# Patient Record
Sex: Female | Born: 1940 | Race: White | Hispanic: No | Marital: Married | Smoking: Never smoker
Health system: Southern US, Community
[De-identification: ages and names within clinical notes are randomized; demographics above are authoritative.]

## PROBLEM LIST (undated history)

## (undated) DIAGNOSIS — E785 Hyperlipidemia, unspecified: Secondary | ICD-10-CM

## (undated) DIAGNOSIS — E669 Obesity, unspecified: Secondary | ICD-10-CM

## (undated) DIAGNOSIS — K219 Gastro-esophageal reflux disease without esophagitis: Secondary | ICD-10-CM

## (undated) DIAGNOSIS — F419 Anxiety disorder, unspecified: Secondary | ICD-10-CM

## (undated) DIAGNOSIS — I10 Essential (primary) hypertension: Secondary | ICD-10-CM

## (undated) DIAGNOSIS — R112 Nausea with vomiting, unspecified: Secondary | ICD-10-CM

## (undated) DIAGNOSIS — T4145XA Adverse effect of unspecified anesthetic, initial encounter: Secondary | ICD-10-CM

## (undated) DIAGNOSIS — H409 Unspecified glaucoma: Secondary | ICD-10-CM

## (undated) DIAGNOSIS — E559 Vitamin D deficiency, unspecified: Secondary | ICD-10-CM

## (undated) DIAGNOSIS — G4700 Insomnia, unspecified: Secondary | ICD-10-CM

## (undated) DIAGNOSIS — F32A Depression, unspecified: Secondary | ICD-10-CM

## (undated) DIAGNOSIS — Z9889 Other specified postprocedural states: Secondary | ICD-10-CM

## (undated) DIAGNOSIS — T8859XA Other complications of anesthesia, initial encounter: Secondary | ICD-10-CM

## (undated) DIAGNOSIS — R739 Hyperglycemia, unspecified: Secondary | ICD-10-CM

## (undated) HISTORY — PX: ANKLE SURGERY: SHX546

## (undated) HISTORY — DX: Insomnia, unspecified: G47.00

## (undated) HISTORY — PX: EYE SURGERY: SHX253

## (undated) HISTORY — DX: Hyperglycemia, unspecified: R73.9

## (undated) HISTORY — PX: CHOLECYSTECTOMY: SHX55

## (undated) HISTORY — DX: Hyperlipidemia, unspecified: E78.5

## (undated) HISTORY — DX: Depression, unspecified: F32.A

## (undated) HISTORY — DX: Vitamin D deficiency, unspecified: E55.9

## (undated) HISTORY — DX: Unspecified glaucoma: H40.9

## (undated) HISTORY — DX: Obesity, unspecified: E66.9

---

## 2003-10-28 ENCOUNTER — Encounter: Admission: RE | Admit: 2003-10-28 | Discharge: 2003-10-28 | Payer: Self-pay | Admitting: Internal Medicine

## 2004-10-29 ENCOUNTER — Encounter: Admission: RE | Admit: 2004-10-29 | Discharge: 2004-10-29 | Payer: Self-pay | Admitting: Internal Medicine

## 2005-09-26 ENCOUNTER — Other Ambulatory Visit: Admission: RE | Admit: 2005-09-26 | Discharge: 2005-09-26 | Payer: Self-pay | Admitting: Internal Medicine

## 2005-10-30 ENCOUNTER — Encounter: Admission: RE | Admit: 2005-10-30 | Discharge: 2005-10-30 | Payer: Self-pay | Admitting: Internal Medicine

## 2006-11-03 ENCOUNTER — Encounter: Admission: RE | Admit: 2006-11-03 | Discharge: 2006-11-03 | Payer: Self-pay | Admitting: Internal Medicine

## 2007-11-13 ENCOUNTER — Encounter: Admission: RE | Admit: 2007-11-13 | Discharge: 2007-11-13 | Payer: Self-pay | Admitting: Internal Medicine

## 2008-11-23 ENCOUNTER — Encounter: Admission: RE | Admit: 2008-11-23 | Discharge: 2008-11-23 | Payer: Self-pay | Admitting: Internal Medicine

## 2009-11-30 ENCOUNTER — Encounter: Admission: RE | Admit: 2009-11-30 | Discharge: 2009-11-30 | Payer: Self-pay | Admitting: Internal Medicine

## 2010-02-03 ENCOUNTER — Encounter: Payer: Self-pay | Admitting: Internal Medicine

## 2010-10-03 ENCOUNTER — Other Ambulatory Visit: Payer: Self-pay | Admitting: Internal Medicine

## 2010-10-03 DIAGNOSIS — Z1231 Encounter for screening mammogram for malignant neoplasm of breast: Secondary | ICD-10-CM

## 2010-12-03 ENCOUNTER — Ambulatory Visit
Admission: RE | Admit: 2010-12-03 | Discharge: 2010-12-03 | Disposition: A | Discharge status: Home / Self Care | Payer: Medicare Other | Source: Ambulatory Visit | Attending: Internal Medicine | Admitting: Internal Medicine

## 2010-12-03 DIAGNOSIS — Z1231 Encounter for screening mammogram for malignant neoplasm of breast: Secondary | ICD-10-CM

## 2011-02-08 DIAGNOSIS — R7301 Impaired fasting glucose: Secondary | ICD-10-CM | POA: Diagnosis not present

## 2011-02-08 DIAGNOSIS — H409 Unspecified glaucoma: Secondary | ICD-10-CM | POA: Diagnosis not present

## 2011-02-08 DIAGNOSIS — Z0000 Encounter for general adult medical examination without abnormal findings: Secondary | ICD-10-CM | POA: Diagnosis not present

## 2011-02-08 DIAGNOSIS — E785 Hyperlipidemia, unspecified: Secondary | ICD-10-CM | POA: Diagnosis not present

## 2011-02-15 DIAGNOSIS — R7301 Impaired fasting glucose: Secondary | ICD-10-CM | POA: Diagnosis not present

## 2011-02-15 DIAGNOSIS — F411 Generalized anxiety disorder: Secondary | ICD-10-CM | POA: Diagnosis not present

## 2011-02-15 DIAGNOSIS — E785 Hyperlipidemia, unspecified: Secondary | ICD-10-CM | POA: Diagnosis not present

## 2011-03-05 DIAGNOSIS — H4011X0 Primary open-angle glaucoma, stage unspecified: Secondary | ICD-10-CM | POA: Diagnosis not present

## 2011-04-23 DIAGNOSIS — M79609 Pain in unspecified limb: Secondary | ICD-10-CM | POA: Diagnosis not present

## 2011-05-01 DIAGNOSIS — M79609 Pain in unspecified limb: Secondary | ICD-10-CM | POA: Diagnosis not present

## 2011-05-01 DIAGNOSIS — I8310 Varicose veins of unspecified lower extremity with inflammation: Secondary | ICD-10-CM | POA: Diagnosis not present

## 2011-05-18 DIAGNOSIS — R030 Elevated blood-pressure reading, without diagnosis of hypertension: Secondary | ICD-10-CM | POA: Diagnosis not present

## 2011-05-18 DIAGNOSIS — J309 Allergic rhinitis, unspecified: Secondary | ICD-10-CM | POA: Diagnosis not present

## 2011-05-18 DIAGNOSIS — J209 Acute bronchitis, unspecified: Secondary | ICD-10-CM | POA: Diagnosis not present

## 2011-05-21 DIAGNOSIS — J209 Acute bronchitis, unspecified: Secondary | ICD-10-CM | POA: Diagnosis not present

## 2011-05-27 DIAGNOSIS — I8310 Varicose veins of unspecified lower extremity with inflammation: Secondary | ICD-10-CM | POA: Diagnosis not present

## 2011-05-27 DIAGNOSIS — M79609 Pain in unspecified limb: Secondary | ICD-10-CM | POA: Diagnosis not present

## 2011-09-04 DIAGNOSIS — H4011X0 Primary open-angle glaucoma, stage unspecified: Secondary | ICD-10-CM | POA: Diagnosis not present

## 2011-09-04 DIAGNOSIS — H409 Unspecified glaucoma: Secondary | ICD-10-CM | POA: Diagnosis not present

## 2011-09-19 DIAGNOSIS — S51009A Unspecified open wound of unspecified elbow, initial encounter: Secondary | ICD-10-CM | POA: Diagnosis not present

## 2011-10-03 DIAGNOSIS — Z23 Encounter for immunization: Secondary | ICD-10-CM | POA: Diagnosis not present

## 2011-10-24 ENCOUNTER — Other Ambulatory Visit: Payer: Self-pay | Admitting: Internal Medicine

## 2011-10-24 DIAGNOSIS — Z1231 Encounter for screening mammogram for malignant neoplasm of breast: Secondary | ICD-10-CM

## 2011-12-02 DIAGNOSIS — I8310 Varicose veins of unspecified lower extremity with inflammation: Secondary | ICD-10-CM | POA: Diagnosis not present

## 2011-12-03 DIAGNOSIS — I8310 Varicose veins of unspecified lower extremity with inflammation: Secondary | ICD-10-CM | POA: Diagnosis not present

## 2011-12-04 ENCOUNTER — Ambulatory Visit
Admission: RE | Admit: 2011-12-04 | Discharge: 2011-12-04 | Disposition: A | Discharge status: Home / Self Care | Payer: Medicare Other | Source: Ambulatory Visit | Attending: Internal Medicine | Admitting: Internal Medicine

## 2011-12-04 DIAGNOSIS — Z1231 Encounter for screening mammogram for malignant neoplasm of breast: Secondary | ICD-10-CM

## 2011-12-05 DIAGNOSIS — I8310 Varicose veins of unspecified lower extremity with inflammation: Secondary | ICD-10-CM | POA: Diagnosis not present

## 2011-12-09 DIAGNOSIS — I8310 Varicose veins of unspecified lower extremity with inflammation: Secondary | ICD-10-CM | POA: Diagnosis not present

## 2011-12-09 DIAGNOSIS — M79609 Pain in unspecified limb: Secondary | ICD-10-CM | POA: Diagnosis not present

## 2011-12-26 DIAGNOSIS — M79609 Pain in unspecified limb: Secondary | ICD-10-CM | POA: Diagnosis not present

## 2011-12-26 DIAGNOSIS — I8310 Varicose veins of unspecified lower extremity with inflammation: Secondary | ICD-10-CM | POA: Diagnosis not present

## 2012-02-17 DIAGNOSIS — Z1331 Encounter for screening for depression: Secondary | ICD-10-CM | POA: Diagnosis not present

## 2012-02-17 DIAGNOSIS — R7301 Impaired fasting glucose: Secondary | ICD-10-CM | POA: Diagnosis not present

## 2012-02-17 DIAGNOSIS — F411 Generalized anxiety disorder: Secondary | ICD-10-CM | POA: Diagnosis not present

## 2012-02-17 DIAGNOSIS — Z0000 Encounter for general adult medical examination without abnormal findings: Secondary | ICD-10-CM | POA: Diagnosis not present

## 2012-02-17 DIAGNOSIS — E785 Hyperlipidemia, unspecified: Secondary | ICD-10-CM | POA: Diagnosis not present

## 2012-08-11 DIAGNOSIS — J019 Acute sinusitis, unspecified: Secondary | ICD-10-CM | POA: Diagnosis not present

## 2012-08-11 DIAGNOSIS — R42 Dizziness and giddiness: Secondary | ICD-10-CM | POA: Diagnosis not present

## 2012-08-30 DIAGNOSIS — H6980 Other specified disorders of Eustachian tube, unspecified ear: Secondary | ICD-10-CM | POA: Diagnosis not present

## 2012-09-02 DIAGNOSIS — M950 Acquired deformity of nose: Secondary | ICD-10-CM | POA: Diagnosis not present

## 2012-09-02 DIAGNOSIS — J3489 Other specified disorders of nose and nasal sinuses: Secondary | ICD-10-CM | POA: Diagnosis not present

## 2012-09-02 DIAGNOSIS — R42 Dizziness and giddiness: Secondary | ICD-10-CM | POA: Diagnosis not present

## 2012-09-02 DIAGNOSIS — H905 Unspecified sensorineural hearing loss: Secondary | ICD-10-CM | POA: Diagnosis not present

## 2012-09-30 DIAGNOSIS — Z23 Encounter for immunization: Secondary | ICD-10-CM | POA: Diagnosis not present

## 2012-10-07 DIAGNOSIS — H4011X0 Primary open-angle glaucoma, stage unspecified: Secondary | ICD-10-CM | POA: Diagnosis not present

## 2012-10-07 DIAGNOSIS — H409 Unspecified glaucoma: Secondary | ICD-10-CM | POA: Diagnosis not present

## 2012-10-29 ENCOUNTER — Other Ambulatory Visit: Payer: Self-pay

## 2012-10-29 DIAGNOSIS — Z1231 Encounter for screening mammogram for malignant neoplasm of breast: Secondary | ICD-10-CM

## 2012-12-04 ENCOUNTER — Ambulatory Visit
Admission: RE | Admit: 2012-12-04 | Discharge: 2012-12-04 | Disposition: A | Discharge status: Home / Self Care | Payer: Medicare Other | Source: Ambulatory Visit

## 2012-12-04 DIAGNOSIS — E785 Hyperlipidemia, unspecified: Secondary | ICD-10-CM | POA: Diagnosis not present

## 2012-12-04 DIAGNOSIS — R7301 Impaired fasting glucose: Secondary | ICD-10-CM | POA: Diagnosis not present

## 2012-12-04 DIAGNOSIS — Z23 Encounter for immunization: Secondary | ICD-10-CM | POA: Diagnosis not present

## 2012-12-04 DIAGNOSIS — F411 Generalized anxiety disorder: Secondary | ICD-10-CM | POA: Diagnosis not present

## 2012-12-04 DIAGNOSIS — F329 Major depressive disorder, single episode, unspecified: Secondary | ICD-10-CM | POA: Diagnosis not present

## 2012-12-04 DIAGNOSIS — Z1231 Encounter for screening mammogram for malignant neoplasm of breast: Secondary | ICD-10-CM

## 2013-01-21 DIAGNOSIS — J111 Influenza due to unidentified influenza virus with other respiratory manifestations: Secondary | ICD-10-CM | POA: Diagnosis not present

## 2013-02-19 DIAGNOSIS — R030 Elevated blood-pressure reading, without diagnosis of hypertension: Secondary | ICD-10-CM | POA: Diagnosis not present

## 2013-02-19 DIAGNOSIS — E785 Hyperlipidemia, unspecified: Secondary | ICD-10-CM | POA: Diagnosis not present

## 2013-02-19 DIAGNOSIS — R7301 Impaired fasting glucose: Secondary | ICD-10-CM | POA: Diagnosis not present

## 2013-02-19 DIAGNOSIS — F329 Major depressive disorder, single episode, unspecified: Secondary | ICD-10-CM | POA: Diagnosis not present

## 2013-02-19 DIAGNOSIS — F411 Generalized anxiety disorder: Secondary | ICD-10-CM | POA: Diagnosis not present

## 2013-02-19 DIAGNOSIS — Z0000 Encounter for general adult medical examination without abnormal findings: Secondary | ICD-10-CM | POA: Diagnosis not present

## 2013-02-19 DIAGNOSIS — Z1331 Encounter for screening for depression: Secondary | ICD-10-CM | POA: Diagnosis not present

## 2013-02-19 DIAGNOSIS — Z79899 Other long term (current) drug therapy: Secondary | ICD-10-CM | POA: Diagnosis not present

## 2013-03-16 ENCOUNTER — Emergency Department (HOSPITAL_COMMUNITY)
Admission: EM | Admit: 2013-03-16 | Discharge: 2013-03-16 | Disposition: A | Discharge status: Home / Self Care | Payer: Medicare Other | Attending: Emergency Medicine | Admitting: Emergency Medicine

## 2013-03-16 ENCOUNTER — Emergency Department (HOSPITAL_COMMUNITY): Payer: Medicare Other

## 2013-03-16 ENCOUNTER — Encounter (HOSPITAL_COMMUNITY): Payer: Self-pay | Admitting: Emergency Medicine

## 2013-03-16 DIAGNOSIS — S838X9A Sprain of other specified parts of unspecified knee, initial encounter: Secondary | ICD-10-CM | POA: Diagnosis not present

## 2013-03-16 DIAGNOSIS — S82853A Displaced trimalleolar fracture of unspecified lower leg, initial encounter for closed fracture: Secondary | ICD-10-CM | POA: Insufficient documentation

## 2013-03-16 DIAGNOSIS — Z79899 Other long term (current) drug therapy: Secondary | ICD-10-CM | POA: Insufficient documentation

## 2013-03-16 DIAGNOSIS — Z4789 Encounter for other orthopedic aftercare: Secondary | ICD-10-CM | POA: Diagnosis not present

## 2013-03-16 DIAGNOSIS — S82852A Displaced trimalleolar fracture of left lower leg, initial encounter for closed fracture: Secondary | ICD-10-CM

## 2013-03-16 DIAGNOSIS — Y9389 Activity, other specified: Secondary | ICD-10-CM | POA: Insufficient documentation

## 2013-03-16 DIAGNOSIS — Y929 Unspecified place or not applicable: Secondary | ICD-10-CM | POA: Insufficient documentation

## 2013-03-16 DIAGNOSIS — W010XXA Fall on same level from slipping, tripping and stumbling without subsequent striking against object, initial encounter: Secondary | ICD-10-CM | POA: Insufficient documentation

## 2013-03-16 DIAGNOSIS — S86819A Strain of other muscle(s) and tendon(s) at lower leg level, unspecified leg, initial encounter: Secondary | ICD-10-CM | POA: Diagnosis not present

## 2013-03-16 MED ORDER — LIDOCAINE HCL (CARDIAC) 20 MG/ML IV SOLN
INTRAVENOUS | Status: AC
  Filled 2013-03-16: qty 5

## 2013-03-16 MED ORDER — ETOMIDATE 2 MG/ML IV SOLN
INTRAVENOUS | Status: AC
  Administered 2013-03-16: 10 mg via INTRAVENOUS
  Filled 2013-03-16: qty 20

## 2013-03-16 MED ORDER — HYDROMORPHONE HCL PF 1 MG/ML IJ SOLN
1.0000 mg | Freq: Once | INTRAMUSCULAR | Status: AC
  Administered 2013-03-16: 1 mg via INTRAVENOUS
  Filled 2013-03-16: qty 1

## 2013-03-16 MED ORDER — HYDROCODONE-ACETAMINOPHEN 5-325 MG PO TABS: 2.0000 | ORAL_TABLET | ORAL | Status: DC | PRN

## 2013-03-16 MED ORDER — SUCCINYLCHOLINE CHLORIDE 20 MG/ML IJ SOLN
INTRAMUSCULAR | Status: AC
  Filled 2013-03-16: qty 1

## 2013-03-16 MED ORDER — MIDAZOLAM HCL 2 MG/2ML IJ SOLN
1.0000 mg | Freq: Once | INTRAMUSCULAR | Status: AC
  Administered 2013-03-16: 1 mg via INTRAVENOUS
  Filled 2013-03-16: qty 2

## 2013-03-16 MED ORDER — MIDAZOLAM HCL 2 MG/2ML IJ SOLN
1.0000 mg | Freq: Once | INTRAMUSCULAR | Status: AC
  Administered 2013-03-16: 1 mg via INTRAVENOUS

## 2013-03-16 MED ORDER — ROCURONIUM BROMIDE 50 MG/5ML IV SOLN
INTRAVENOUS | Status: AC
  Filled 2013-03-16: qty 2

## 2013-03-16 NOTE — ED Provider Notes (Signed)
CSN: [id number]     Arrival date & time 03/16/13  0905 History   First MD Initiated Contact with Patient 03/16/13 [phone]     Chief Complaint  Patient presents with  . Ankle Pain     (Consider location/radiation/quality/duration/timing/severity/associated sxs/prior Treatment) HPI Comments: Patient complains of left ankle pain and swelling after tripping on putting her dog out. She missed on concrete step and rolled her ankle medially. She fell onto her buttocks. Did not hit her head or lose consciousness. She denies any head, neck or back pain. She complains of diffuse pain and swelling left ankle. There is no weakness, numbness or tingling. She denies any medical problems and is not taking medications except for antidepressants.  The history is provided by the patient.    History reviewed. No pertinent past medical history. History reviewed. No pertinent past surgical history. History reviewed. No pertinent family history. History  Substance Use Topics  . Smoking status: Never Smoker   . Smokeless tobacco: Not on file  . Alcohol Use: No   OB History   Grav Para Term Preterm Abortions TAB SAB Ect Mult Living                 Review of Systems  Constitutional: Negative for fever, activity change and appetite change.  HENT: Negative for congestion and rhinorrhea.   Respiratory: Negative for cough, chest tightness and shortness of breath.   Cardiovascular: Negative for chest pain.  Gastrointestinal: Negative for nausea, vomiting and abdominal pain.  Genitourinary: Negative for dysuria and hematuria.  Musculoskeletal: Positive for arthralgias and myalgias. Negative for back pain.  Skin: Negative for rash.  Neurological: Negative for dizziness, weakness and headaches.  A complete 10 system review of systems was obtained and all systems are negative except as noted in the HPI and PMH.      Allergies  Review of patient's allergies indicates no known allergies.  Home Medications    Current Outpatient Rx  Name  Route  Sig  Dispense  Refill  . ALPRAZolam (XANAX) 1 MG tablet   Oral   Take 0.5 mg by mouth at bedtime as needed for anxiety.         . calcium-vitamin D (OSCAL WITH D) 500-200 MG-UNIT per tablet   Oral   Take 1 tablet by mouth daily with breakfast.         . citalopram (CELEXA) 20 MG tablet   Oral   Take 30 mg by mouth at bedtime.         . dorzolamide-timolol (COSOPT) 22.3-6.8 MG/ML ophthalmic solution   Both Eyes   Place 1 drop into both eyes 2 (two) times daily.         . Multiple Vitamin (MULTIVITAMIN WITH MINERALS) TABS tablet   Oral   Take 1 tablet by mouth daily.         Marland Kitchen pyridOXINE (VITAMIN B-6) 100 MG tablet   Oral   Take 100 mg by mouth daily.         . Travoprost, BAK Free, (TRAVATAN) 0.004 % SOLN ophthalmic solution   Both Eyes   Place 1 drop into both eyes at bedtime.         . vitamin B-12 (CYANOCOBALAMIN) 1000 MCG tablet   Oral   Take 1,000 mcg by mouth daily.         Marland Kitchen HYDROcodone-acetaminophen (NORCO/VICODIN) 5-325 MG per tablet   Oral   Take 2 tablets by mouth every 4 (four) hours as needed.  20 tablet   0    BP 140/59  Pulse 74  Temp(Src) 97.8 F (36.6 C) (Oral)  Resp 18  SpO2 100% Physical Exam  Constitutional: She is oriented to person, place, and time. She appears well-developed and well-nourished. No distress.  HENT:  Head: Normocephalic and atraumatic.  Mouth/Throat: Oropharynx is clear and moist. No oropharyngeal exudate.  Eyes: Conjunctivae and EOM are normal. Pupils are equal, round, and reactive to light.  Neck: Normal range of motion. Neck supple.  No C spine tenderness  Cardiovascular: Normal rate, regular rhythm and normal heart sounds.   No murmur heard. Pulmonary/Chest: Effort normal and breath sounds normal. No respiratory distress.  Abdominal: Soft. There is no tenderness. There is no rebound and no guarding.  Musculoskeletal: Normal range of motion. She exhibits  tenderness. She exhibits no edema.  No T or L spine tenderness Swelling and tenderness diffusely to L ankle.  Intact DP and PT pulse. No proximal fibula tenderness  Neurological: She is alert and oriented to person, place, and time. No cranial nerve deficit. She exhibits normal muscle tone. Coordination normal.  Skin: Skin is warm.    ED Course  Reduction of dislocation Date/Time: 03/16/2013 10:58 AM Performed by: Ezequiel Essex Authorized by: Ezequiel Essex Consent: Verbal consent obtained. Risks and benefits: risks, benefits and alternatives were discussed Consent given by: patient Patient understanding: patient states understanding of the procedure being performed Patient identity confirmed: verbally with patient Time out: Immediately prior to procedure a "time out" was called to verify the correct patient, procedure, equipment, support staff and site/side marked as required. Preparation: Patient was prepped and draped in the usual sterile fashion. Patient sedated: yes Sedation type: moderate (conscious) sedation Sedatives: etomidate and midazolam Analgesia: hydromorphone Sedation start date/time: 03/16/2013 10:45 AM Sedation end date/time: 03/16/2013 10:59 AM Vitals: Vital signs were monitored during sedation. Patient tolerance: Patient tolerated the procedure well with no immediate complications.   (including critical care time) Labs Review Labs Reviewed - No data to display Imaging Review Dg Tibia/fibula Left  03/16/2013   CLINICAL DATA:  73 year old female status post twisting injury and fall. Initial encounter.  EXAM: LEFT TIBIA AND FIBULA - 2 VIEW  COMPARISON:  Left ankle series from the same day reported separately.  FINDINGS: Posterior fracture dislocation partially visible at the left ankle, see comparison.  The mid and proximal left tibia and fibula appear to remain intact. Subtle dystrophic calcification along the medial aspect of the proximal left fibula meta diaphysis  suspected. Left knee alignment appears stable.  IMPRESSION: 1. See left ankle series reported separately. 2. No more proximal fracture of the left tibia or fibula identified; mild dystrophic calcification suspected at the medial left fibula proximal meta diaphysis.   Electronically Signed   By: Lars Pinks M.D.   On: 03/16/2013 09:58   Dg Ankle Complete Left  03/16/2013   CLINICAL DATA:  73 year old female status post twisting injury in fall. Initial encounter.  EXAM: LEFT ANKLE COMPLETE - 3+ VIEW  COMPARISON:  None.  FINDINGS: Trimalleolar fracture dislocation of the left ankle. Posterior dislocation of the mortise joint by 2-3 cm. Comminuted mildly displaced fractures of the distal left fibula meta diaphysis in the medial malleolus. Impacted probably comminuted fracture of the posterior malleolus. Calcaneus appears intact.  IMPRESSION: Trimalleolar posterior fracture dislocation of the mortise joint.   Electronically Signed   By: Lars Pinks M.D.   On: 03/16/2013 09:47   Dg Ankle Left Port  03/16/2013   CLINICAL DATA:  Left ankle fracture.  Postreduction.  EXAM: PORTABLE LEFT ANKLE - 2 VIEW  COMPARISON:  Pre reduction images, 03/16/2013  FINDINGS: Ankle is now supported in a fiberglass cast. The ankle dislocation has been reduced. Fractures are now in near anatomic alignment.  IMPRESSION: Successful closed reduction of the left ankle fracture dislocation.   Electronically Signed   By: Lajean Manes M.D.   On: 03/16/2013 11:24     EKG Interpretation None      MDM   Final diagnoses:  Closed trimalleolar fracture of left ankle   Mechanical slip and fall with left ankle pain. Did not hit head or lose consciousness. Neurovascularly intact.  X-ray shows trimalleolar ankle fracture with posterior dislocation  Ankle reduction performed as above.  Pulses intact after reduction.  X-ray confirms reduction. Pulses intact after reduction. Discussed with Dr. Marcelino Scot who agrees with splint, nonweightbearing, and  followup in the office in 2 weeks.  Patient will remain nonweightbearing. Return precautions discussed including worsening pain, numbness, tearing, color change or temperature change. Compartments soft on discharge and pulses intact.  BP 140/59  Pulse 74  Temp(Src) 97.8 F (36.6 C) (Oral)  Resp 18  SpO2 100%   Ezequiel Essex, MD 03/16/13 [phone]

## 2013-03-16 NOTE — ED Notes (Signed)
Patient waiting on post reduction x-ray, patient and family updated.

## 2013-03-16 NOTE — ED Notes (Signed)
Pt c/o lt ankle pain after a trip and fall over her dog this morning.

## 2013-03-16 NOTE — Progress Notes (Signed)
   CARE MANAGEMENT ED NOTE 03/16/2013  Patient:  Kristy Hebert, Kristy Hebert   Account Number:  [id number]  Date Initiated:  03/16/2013  Documentation initiated by:  Jackelyn Poling  Subjective/Objective Assessment:   73 yr old medicare pt c/o left ankle pain & fell over her dog s/p reduction completed with cast to left LE Pt states she is Right side dominant Given crutches by Ortho tech     Subjective/Objective Assessment Detail:   Pt states she does not have a RW at home but thinks she will be ok with only her crutches Reports having her daughter at her bedside and others to be with her at home     Action/Plan:   Cm spoke with pt to confirm pcp as [facility] Updated EPIC answered questions about home health services r/t her & her husband receiving oncology services CM provided pt a list of [place] home health agencies & referred herto pcp   Action/Plan Detail:   for any further orders for her husband   Anticipated DC Date:  03/16/2013     Status Recommendation to Physician:   Result of Recommendation:    Other ED [place]  Other  PCP issues  Outpatient Services - Pt will follow up   [place]   Choice offered to / List presented to:            Status of service:  Completed, signed off  ED Comments:   ED Comments Detail:

## 2013-03-16 NOTE — Discharge Instructions (Signed)
Ankle Fracture Follow up with Dr. Marcelino Scot this week. Return to the ED if you develop worsening pain, numbness, color change, temperature change or any other concerns. A fracture is a break in the bone. A cast or splint is used to protect and keep your injured bone from moving.  HOME CARE INSTRUCTIONS   Use your crutches as directed.  To lessen the swelling, keep the injured leg elevated while sitting or lying down.  Apply ice to the injury for 15-20 minutes, 03-04 times per day while awake for 2 days. Put the ice in a plastic bag and place a thin towel between the bag of ice and your cast.  If you have a plaster or fiberglass cast:  Do not try to scratch the skin under the cast using sharp or pointed objects.  Check the skin around the cast every day. You may put lotion on any red or sore areas.  Keep your cast dry and clean.  If you have a plaster splint:  Wear the splint as directed.  You may loosen the elastic around the splint if your toes become numb, tingle, or turn cold or blue.  Do not put pressure on any part of your cast or splint; it may break. Rest your cast only on a pillow the first 24 hours until it is fully hardened.  Your cast or splint can be protected during bathing with a plastic bag. Do not lower the cast or splint into water.  Take medications as directed by your caregiver. Only take over-the-counter or prescription medicines for pain, discomfort, or fever as directed by your caregiver.  Do not drive a vehicle until your caregiver specifically tells you it is safe to do so.  If your caregiver has given you a follow-up appointment, it is very important to keep that appointment. Not keeping the appointment could result in a chronic or permanent injury, pain, and disability. If there is any problem keeping the appointment, you must call back to this facility for assistance. SEEK IMMEDIATE MEDICAL CARE IF:   Your cast gets damaged or breaks.  You have continued  severe pain or more swelling than you did before the cast was put on.  Your skin or toenails below the injury turn blue or gray, or feel cold or numb.  There is a bad smell or new stains and/or purulent (pus like) drainage coming from under the cast. If you do not have a window in your cast for observing the wound, a discharge or minor bleeding may show up as a stain on the outside of your cast. Report these findings to your caregiver. MAKE SURE YOU:   Understand these instructions.  Will watch your condition.  Will get help right away if you are not doing well or get worse. Document Released: 12/29/1999 Document Revised: 03/25/2011 Document Reviewed: 07/30/2012 [facility] Patient Information 2014 [place], [place].

## 2013-03-17 DIAGNOSIS — S82853A Displaced trimalleolar fracture of unspecified lower leg, initial encounter for closed fracture: Secondary | ICD-10-CM | POA: Diagnosis not present

## 2013-03-24 DIAGNOSIS — R197 Diarrhea, unspecified: Secondary | ICD-10-CM | POA: Diagnosis not present

## 2013-03-24 DIAGNOSIS — R112 Nausea with vomiting, unspecified: Secondary | ICD-10-CM | POA: Diagnosis not present

## 2013-03-26 DIAGNOSIS — S82853A Displaced trimalleolar fracture of unspecified lower leg, initial encounter for closed fracture: Secondary | ICD-10-CM | POA: Diagnosis not present

## 2013-03-26 DIAGNOSIS — Y999 Unspecified external cause status: Secondary | ICD-10-CM | POA: Diagnosis not present

## 2013-03-26 DIAGNOSIS — Y929 Unspecified place or not applicable: Secondary | ICD-10-CM | POA: Diagnosis not present

## 2013-03-26 DIAGNOSIS — Y939 Activity, unspecified: Secondary | ICD-10-CM | POA: Diagnosis not present

## 2013-03-26 DIAGNOSIS — G8918 Other acute postprocedural pain: Secondary | ICD-10-CM | POA: Diagnosis not present

## 2013-03-26 DIAGNOSIS — X58XXXA Exposure to other specified factors, initial encounter: Secondary | ICD-10-CM | POA: Diagnosis not present

## 2013-04-07 DIAGNOSIS — S82853A Displaced trimalleolar fracture of unspecified lower leg, initial encounter for closed fracture: Secondary | ICD-10-CM | POA: Diagnosis not present

## 2013-04-21 DIAGNOSIS — S82853A Displaced trimalleolar fracture of unspecified lower leg, initial encounter for closed fracture: Secondary | ICD-10-CM | POA: Diagnosis not present

## 2013-05-11 DIAGNOSIS — H2510 Age-related nuclear cataract, unspecified eye: Secondary | ICD-10-CM | POA: Diagnosis not present

## 2013-05-11 DIAGNOSIS — H409 Unspecified glaucoma: Secondary | ICD-10-CM | POA: Diagnosis not present

## 2013-05-11 DIAGNOSIS — H524 Presbyopia: Secondary | ICD-10-CM | POA: Diagnosis not present

## 2013-05-11 DIAGNOSIS — H4011X0 Primary open-angle glaucoma, stage unspecified: Secondary | ICD-10-CM | POA: Diagnosis not present

## 2013-05-12 DIAGNOSIS — S82853A Displaced trimalleolar fracture of unspecified lower leg, initial encounter for closed fracture: Secondary | ICD-10-CM | POA: Diagnosis not present

## 2013-05-14 DIAGNOSIS — S82853A Displaced trimalleolar fracture of unspecified lower leg, initial encounter for closed fracture: Secondary | ICD-10-CM | POA: Diagnosis not present

## 2013-05-18 DIAGNOSIS — S82853A Displaced trimalleolar fracture of unspecified lower leg, initial encounter for closed fracture: Secondary | ICD-10-CM | POA: Diagnosis not present

## 2013-05-19 DIAGNOSIS — S82853A Displaced trimalleolar fracture of unspecified lower leg, initial encounter for closed fracture: Secondary | ICD-10-CM | POA: Diagnosis not present

## 2013-05-21 DIAGNOSIS — S82853A Displaced trimalleolar fracture of unspecified lower leg, initial encounter for closed fracture: Secondary | ICD-10-CM | POA: Diagnosis not present

## 2013-05-24 DIAGNOSIS — S82853A Displaced trimalleolar fracture of unspecified lower leg, initial encounter for closed fracture: Secondary | ICD-10-CM | POA: Diagnosis not present

## 2013-05-26 DIAGNOSIS — S82853A Displaced trimalleolar fracture of unspecified lower leg, initial encounter for closed fracture: Secondary | ICD-10-CM | POA: Diagnosis not present

## 2013-05-28 DIAGNOSIS — S82853A Displaced trimalleolar fracture of unspecified lower leg, initial encounter for closed fracture: Secondary | ICD-10-CM | POA: Diagnosis not present

## 2013-05-31 DIAGNOSIS — S82853A Displaced trimalleolar fracture of unspecified lower leg, initial encounter for closed fracture: Secondary | ICD-10-CM | POA: Diagnosis not present

## 2013-06-02 DIAGNOSIS — S82853A Displaced trimalleolar fracture of unspecified lower leg, initial encounter for closed fracture: Secondary | ICD-10-CM | POA: Diagnosis not present

## 2013-06-04 DIAGNOSIS — S82853A Displaced trimalleolar fracture of unspecified lower leg, initial encounter for closed fracture: Secondary | ICD-10-CM | POA: Diagnosis not present

## 2013-06-09 DIAGNOSIS — S82853A Displaced trimalleolar fracture of unspecified lower leg, initial encounter for closed fracture: Secondary | ICD-10-CM | POA: Diagnosis not present

## 2013-06-10 DIAGNOSIS — S82853A Displaced trimalleolar fracture of unspecified lower leg, initial encounter for closed fracture: Secondary | ICD-10-CM | POA: Diagnosis not present

## 2013-06-15 DIAGNOSIS — S82853A Displaced trimalleolar fracture of unspecified lower leg, initial encounter for closed fracture: Secondary | ICD-10-CM | POA: Diagnosis not present

## 2013-06-17 DIAGNOSIS — S82853A Displaced trimalleolar fracture of unspecified lower leg, initial encounter for closed fracture: Secondary | ICD-10-CM | POA: Diagnosis not present

## 2013-06-21 DIAGNOSIS — S82853A Displaced trimalleolar fracture of unspecified lower leg, initial encounter for closed fracture: Secondary | ICD-10-CM | POA: Diagnosis not present

## 2013-06-24 DIAGNOSIS — S82853A Displaced trimalleolar fracture of unspecified lower leg, initial encounter for closed fracture: Secondary | ICD-10-CM | POA: Diagnosis not present

## 2013-06-29 DIAGNOSIS — S82853A Displaced trimalleolar fracture of unspecified lower leg, initial encounter for closed fracture: Secondary | ICD-10-CM | POA: Diagnosis not present

## 2013-07-02 DIAGNOSIS — S82853A Displaced trimalleolar fracture of unspecified lower leg, initial encounter for closed fracture: Secondary | ICD-10-CM | POA: Diagnosis not present

## 2013-07-08 DIAGNOSIS — S82853A Displaced trimalleolar fracture of unspecified lower leg, initial encounter for closed fracture: Secondary | ICD-10-CM | POA: Diagnosis not present

## 2013-08-04 DIAGNOSIS — S82853A Displaced trimalleolar fracture of unspecified lower leg, initial encounter for closed fracture: Secondary | ICD-10-CM | POA: Diagnosis not present

## 2013-08-04 DIAGNOSIS — IMO0001 Reserved for inherently not codable concepts without codable children: Secondary | ICD-10-CM | POA: Diagnosis not present

## 2013-10-01 DIAGNOSIS — Z23 Encounter for immunization: Secondary | ICD-10-CM | POA: Diagnosis not present

## 2013-11-02 ENCOUNTER — Other Ambulatory Visit: Payer: Self-pay

## 2013-11-02 DIAGNOSIS — Z1231 Encounter for screening mammogram for malignant neoplasm of breast: Secondary | ICD-10-CM

## 2013-11-02 DIAGNOSIS — Z1239 Encounter for other screening for malignant neoplasm of breast: Secondary | ICD-10-CM

## 2013-11-11 DIAGNOSIS — H4011X1 Primary open-angle glaucoma, mild stage: Secondary | ICD-10-CM | POA: Diagnosis not present

## 2013-12-06 ENCOUNTER — Ambulatory Visit
Admission: RE | Admit: 2013-12-06 | Discharge: 2013-12-06 | Disposition: A | Discharge status: Home / Self Care | Payer: Medicare Other | Source: Ambulatory Visit

## 2013-12-06 DIAGNOSIS — Z1231 Encounter for screening mammogram for malignant neoplasm of breast: Secondary | ICD-10-CM | POA: Diagnosis not present

## 2014-03-16 DIAGNOSIS — E784 Other hyperlipidemia: Secondary | ICD-10-CM | POA: Diagnosis not present

## 2014-03-16 DIAGNOSIS — R739 Hyperglycemia, unspecified: Secondary | ICD-10-CM | POA: Diagnosis not present

## 2014-03-16 DIAGNOSIS — F419 Anxiety disorder, unspecified: Secondary | ICD-10-CM | POA: Diagnosis not present

## 2014-03-16 DIAGNOSIS — Z0001 Encounter for general adult medical examination with abnormal findings: Secondary | ICD-10-CM | POA: Diagnosis not present

## 2014-03-16 DIAGNOSIS — R030 Elevated blood-pressure reading, without diagnosis of hypertension: Secondary | ICD-10-CM | POA: Diagnosis not present

## 2014-03-16 DIAGNOSIS — F329 Major depressive disorder, single episode, unspecified: Secondary | ICD-10-CM | POA: Diagnosis not present

## 2014-03-16 DIAGNOSIS — E559 Vitamin D deficiency, unspecified: Secondary | ICD-10-CM | POA: Diagnosis not present

## 2014-03-16 DIAGNOSIS — Z1389 Encounter for screening for other disorder: Secondary | ICD-10-CM | POA: Diagnosis not present

## 2014-04-19 DIAGNOSIS — E559 Vitamin D deficiency, unspecified: Secondary | ICD-10-CM | POA: Diagnosis not present

## 2014-04-19 DIAGNOSIS — E784 Other hyperlipidemia: Secondary | ICD-10-CM | POA: Diagnosis not present

## 2014-04-19 DIAGNOSIS — F329 Major depressive disorder, single episode, unspecified: Secondary | ICD-10-CM | POA: Diagnosis not present

## 2014-04-19 DIAGNOSIS — R739 Hyperglycemia, unspecified: Secondary | ICD-10-CM | POA: Diagnosis not present

## 2014-04-19 DIAGNOSIS — I10 Essential (primary) hypertension: Secondary | ICD-10-CM | POA: Diagnosis not present

## 2014-04-19 DIAGNOSIS — F419 Anxiety disorder, unspecified: Secondary | ICD-10-CM | POA: Diagnosis not present

## 2014-05-17 DIAGNOSIS — H35371 Puckering of macula, right eye: Secondary | ICD-10-CM | POA: Diagnosis not present

## 2014-05-17 DIAGNOSIS — H43813 Vitreous degeneration, bilateral: Secondary | ICD-10-CM | POA: Diagnosis not present

## 2014-05-17 DIAGNOSIS — H2513 Age-related nuclear cataract, bilateral: Secondary | ICD-10-CM | POA: Diagnosis not present

## 2014-05-17 DIAGNOSIS — H4011X1 Primary open-angle glaucoma, mild stage: Secondary | ICD-10-CM | POA: Diagnosis not present

## 2014-05-19 DIAGNOSIS — I10 Essential (primary) hypertension: Secondary | ICD-10-CM | POA: Diagnosis not present

## 2014-05-20 DIAGNOSIS — D485 Neoplasm of uncertain behavior of skin: Secondary | ICD-10-CM | POA: Diagnosis not present

## 2014-05-20 DIAGNOSIS — L570 Actinic keratosis: Secondary | ICD-10-CM | POA: Diagnosis not present

## 2014-05-20 DIAGNOSIS — D239 Other benign neoplasm of skin, unspecified: Secondary | ICD-10-CM | POA: Diagnosis not present

## 2014-05-20 DIAGNOSIS — D2372 Other benign neoplasm of skin of left lower limb, including hip: Secondary | ICD-10-CM | POA: Diagnosis not present

## 2014-07-15 DIAGNOSIS — L570 Actinic keratosis: Secondary | ICD-10-CM | POA: Diagnosis not present

## 2014-08-29 DIAGNOSIS — I10 Essential (primary) hypertension: Secondary | ICD-10-CM | POA: Diagnosis not present

## 2014-10-04 DIAGNOSIS — Z23 Encounter for immunization: Secondary | ICD-10-CM | POA: Diagnosis not present

## 2014-11-01 ENCOUNTER — Other Ambulatory Visit: Payer: Self-pay

## 2014-11-01 DIAGNOSIS — Z1231 Encounter for screening mammogram for malignant neoplasm of breast: Secondary | ICD-10-CM

## 2014-11-15 DIAGNOSIS — H401131 Primary open-angle glaucoma, bilateral, mild stage: Secondary | ICD-10-CM | POA: Diagnosis not present

## 2014-11-17 DIAGNOSIS — L570 Actinic keratosis: Secondary | ICD-10-CM | POA: Diagnosis not present

## 2014-12-12 ENCOUNTER — Ambulatory Visit
Admission: RE | Admit: 2014-12-12 | Discharge: 2014-12-12 | Disposition: A | Discharge status: Home / Self Care | Payer: Medicare Other | Source: Ambulatory Visit

## 2014-12-12 DIAGNOSIS — Z1231 Encounter for screening mammogram for malignant neoplasm of breast: Secondary | ICD-10-CM | POA: Diagnosis not present

## 2015-01-19 DIAGNOSIS — J209 Acute bronchitis, unspecified: Secondary | ICD-10-CM | POA: Diagnosis not present

## 2015-03-30 DIAGNOSIS — R739 Hyperglycemia, unspecified: Secondary | ICD-10-CM | POA: Diagnosis not present

## 2015-03-30 DIAGNOSIS — Z0001 Encounter for general adult medical examination with abnormal findings: Secondary | ICD-10-CM | POA: Diagnosis not present

## 2015-03-30 DIAGNOSIS — F419 Anxiety disorder, unspecified: Secondary | ICD-10-CM | POA: Diagnosis not present

## 2015-03-30 DIAGNOSIS — E784 Other hyperlipidemia: Secondary | ICD-10-CM | POA: Diagnosis not present

## 2015-03-30 DIAGNOSIS — E559 Vitamin D deficiency, unspecified: Secondary | ICD-10-CM | POA: Diagnosis not present

## 2015-03-30 DIAGNOSIS — Z1389 Encounter for screening for other disorder: Secondary | ICD-10-CM | POA: Diagnosis not present

## 2015-03-30 DIAGNOSIS — Z79899 Other long term (current) drug therapy: Secondary | ICD-10-CM | POA: Diagnosis not present

## 2015-03-30 DIAGNOSIS — Z6830 Body mass index (BMI) 30.0-30.9, adult: Secondary | ICD-10-CM | POA: Diagnosis not present

## 2015-03-30 DIAGNOSIS — E669 Obesity, unspecified: Secondary | ICD-10-CM | POA: Diagnosis not present

## 2015-03-30 DIAGNOSIS — Z23 Encounter for immunization: Secondary | ICD-10-CM | POA: Diagnosis not present

## 2015-03-30 DIAGNOSIS — I10 Essential (primary) hypertension: Secondary | ICD-10-CM | POA: Diagnosis not present

## 2015-03-30 DIAGNOSIS — F329 Major depressive disorder, single episode, unspecified: Secondary | ICD-10-CM | POA: Diagnosis not present

## 2015-05-04 DIAGNOSIS — L820 Inflamed seborrheic keratosis: Secondary | ICD-10-CM | POA: Diagnosis not present

## 2015-05-04 DIAGNOSIS — L570 Actinic keratosis: Secondary | ICD-10-CM | POA: Diagnosis not present

## 2015-05-04 DIAGNOSIS — D485 Neoplasm of uncertain behavior of skin: Secondary | ICD-10-CM | POA: Diagnosis not present

## 2015-05-16 DIAGNOSIS — H5203 Hypermetropia, bilateral: Secondary | ICD-10-CM | POA: Diagnosis not present

## 2015-05-16 DIAGNOSIS — H401122 Primary open-angle glaucoma, left eye, moderate stage: Secondary | ICD-10-CM | POA: Diagnosis not present

## 2015-05-16 DIAGNOSIS — H2513 Age-related nuclear cataract, bilateral: Secondary | ICD-10-CM | POA: Diagnosis not present

## 2015-05-16 DIAGNOSIS — H401111 Primary open-angle glaucoma, right eye, mild stage: Secondary | ICD-10-CM | POA: Diagnosis not present

## 2015-09-26 DIAGNOSIS — H401111 Primary open-angle glaucoma, right eye, mild stage: Secondary | ICD-10-CM | POA: Diagnosis not present

## 2015-09-26 DIAGNOSIS — H401122 Primary open-angle glaucoma, left eye, moderate stage: Secondary | ICD-10-CM | POA: Diagnosis not present

## 2015-09-26 DIAGNOSIS — H01004 Unspecified blepharitis left upper eyelid: Secondary | ICD-10-CM | POA: Diagnosis not present

## 2015-09-26 DIAGNOSIS — H01001 Unspecified blepharitis right upper eyelid: Secondary | ICD-10-CM | POA: Diagnosis not present

## 2015-10-05 DIAGNOSIS — Z23 Encounter for immunization: Secondary | ICD-10-CM | POA: Diagnosis not present

## 2015-10-17 ENCOUNTER — Other Ambulatory Visit: Payer: Self-pay | Admitting: Internal Medicine

## 2015-10-17 DIAGNOSIS — Z1231 Encounter for screening mammogram for malignant neoplasm of breast: Secondary | ICD-10-CM

## 2015-10-19 DIAGNOSIS — L570 Actinic keratosis: Secondary | ICD-10-CM | POA: Diagnosis not present

## 2015-10-19 DIAGNOSIS — D239 Other benign neoplasm of skin, unspecified: Secondary | ICD-10-CM | POA: Diagnosis not present

## 2015-10-19 DIAGNOSIS — L821 Other seborrheic keratosis: Secondary | ICD-10-CM | POA: Diagnosis not present

## 2015-12-13 ENCOUNTER — Ambulatory Visit
Admission: RE | Admit: 2015-12-13 | Discharge: 2015-12-13 | Disposition: A | Discharge status: Home / Self Care | Payer: Medicare Other | Source: Ambulatory Visit | Attending: Internal Medicine | Admitting: Internal Medicine

## 2015-12-13 DIAGNOSIS — Z1231 Encounter for screening mammogram for malignant neoplasm of breast: Secondary | ICD-10-CM

## 2016-03-23 IMAGING — MG MM SCREEN MAMMOGRAM BILATERAL
5 series · 5 of 5 positions shown · non-contrast
Comparison: Previous exam(s).

CLINICAL DATA: Screening.

EXAM:
DIGITAL SCREENING BILATERAL MAMMOGRAM WITH CAD

[R CC (1 of 2)]
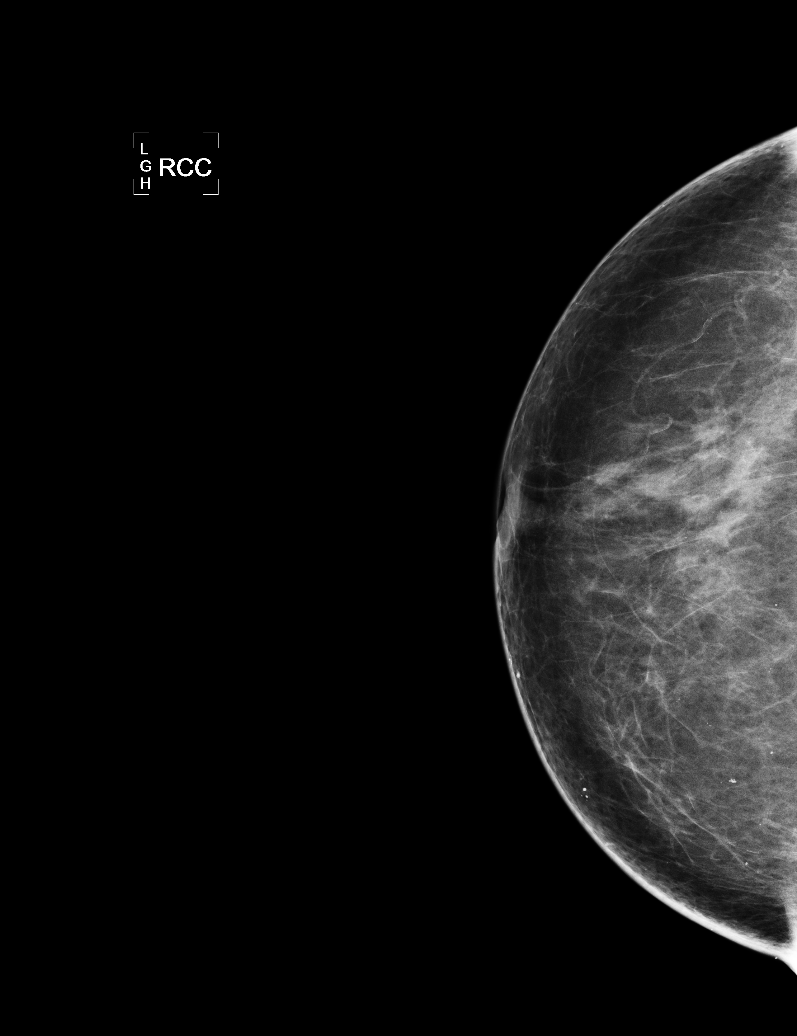

[L CC]
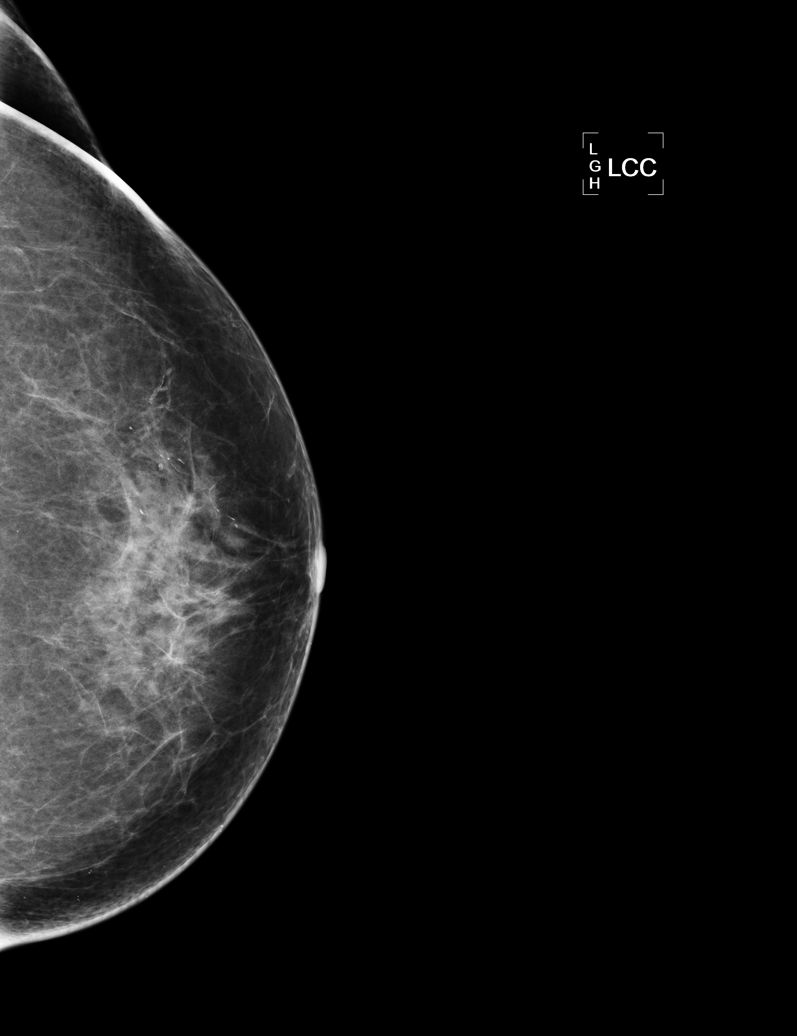

[L MLO]
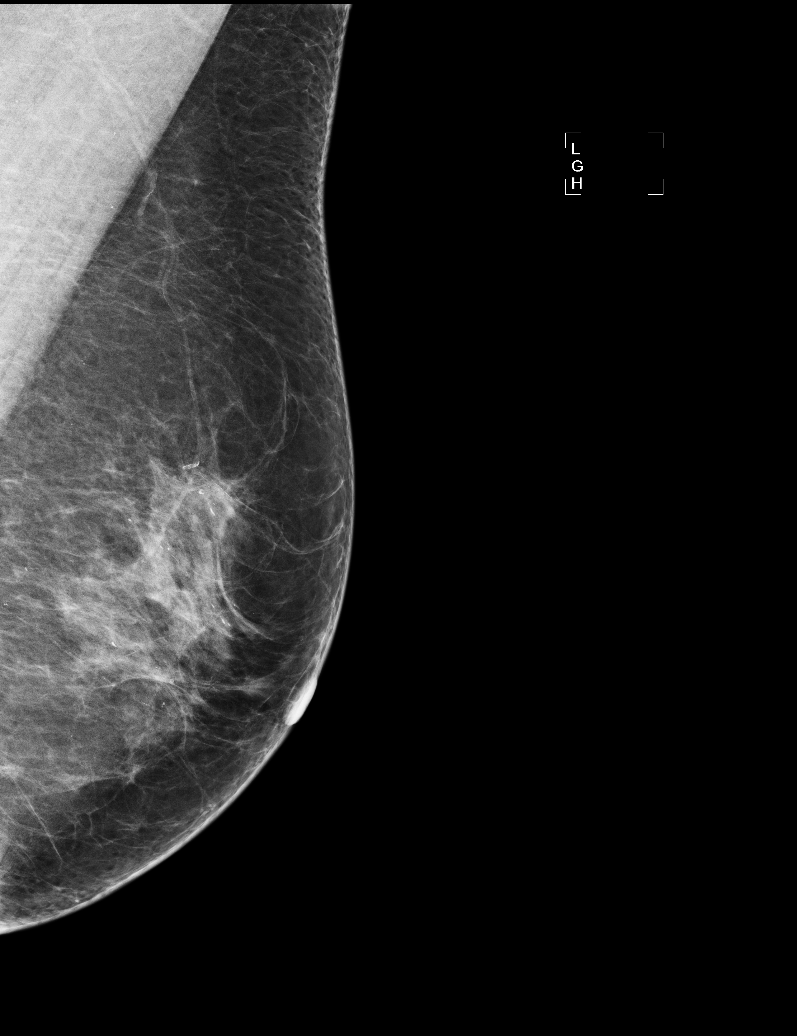

[R MLO]
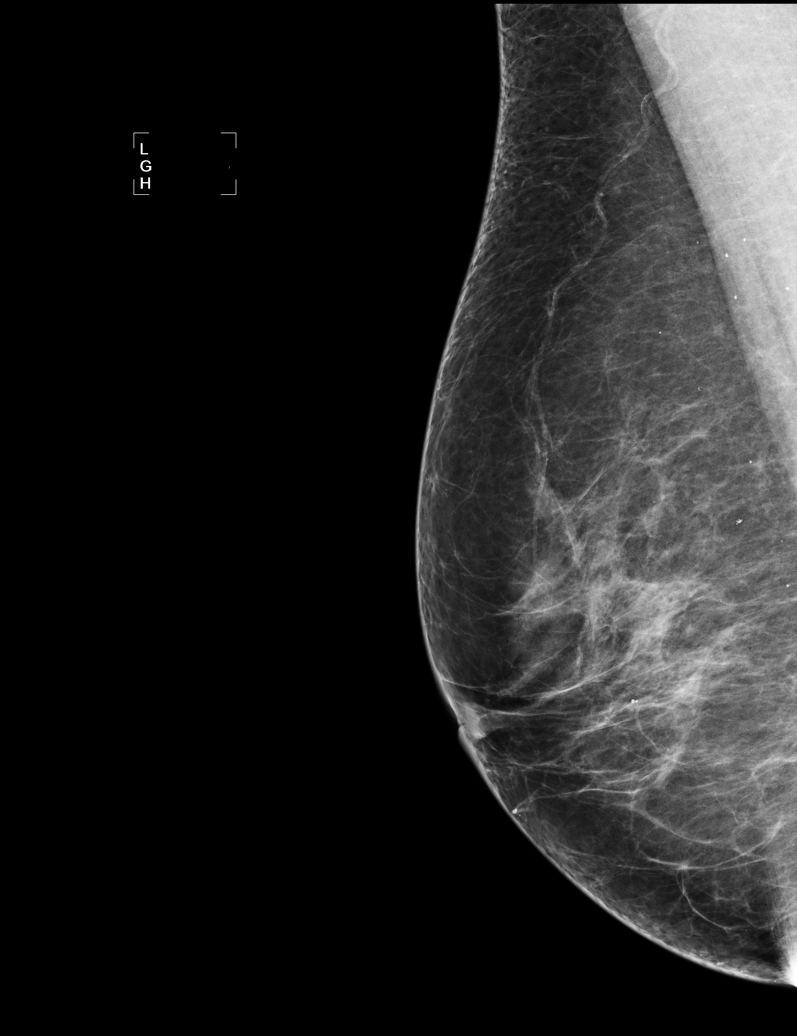

[R CC (2 of 2)]
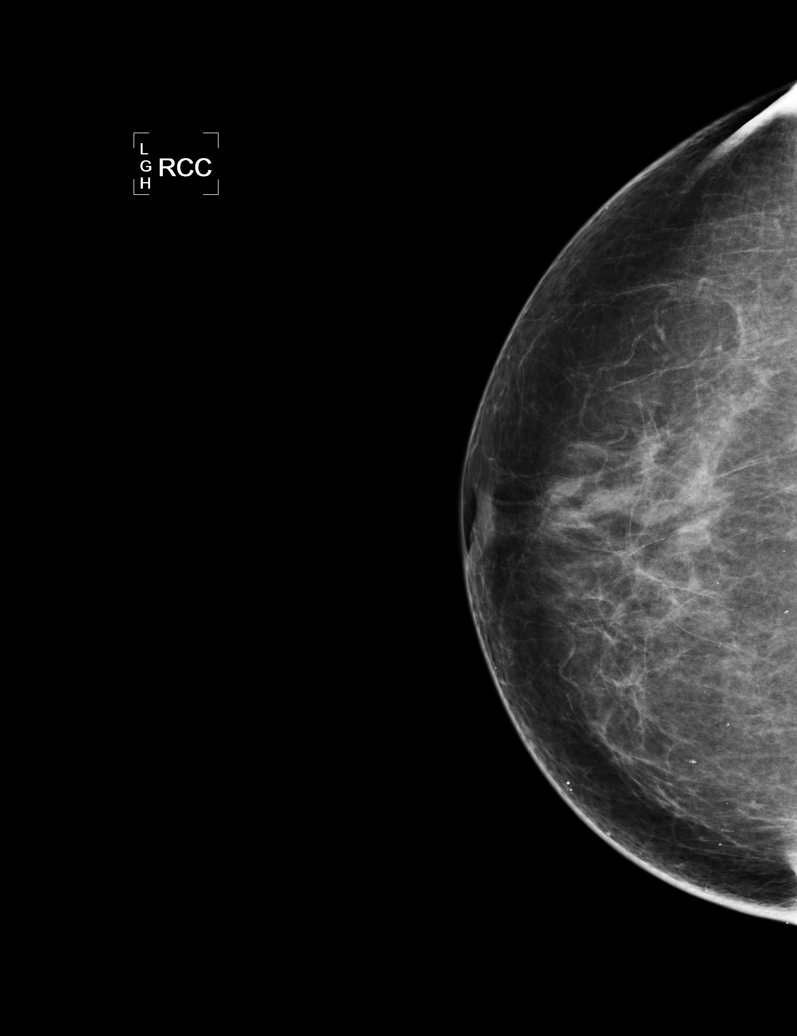

[5 of 5 positions shown; findings below may reference images not displayed]

ACR Breast Density Category b: There are scattered areas of
fibroglandular density.
FINDINGS: There are no findings suspicious for malignancy. Images were
processed with CAD.
IMPRESSION: No mammographic evidence of malignancy. A result letter of this
screening mammogram will be mailed directly to the patient.

RECOMMENDATION:
Screening mammogram in one year. (Code:AS-G-LCT)

BI-RADS CATEGORY  1: Negative.

## 2016-03-26 DIAGNOSIS — H524 Presbyopia: Secondary | ICD-10-CM | POA: Diagnosis not present

## 2016-03-26 DIAGNOSIS — H2513 Age-related nuclear cataract, bilateral: Secondary | ICD-10-CM | POA: Diagnosis not present

## 2016-03-26 DIAGNOSIS — H401131 Primary open-angle glaucoma, bilateral, mild stage: Secondary | ICD-10-CM | POA: Diagnosis not present

## 2016-03-26 DIAGNOSIS — H04123 Dry eye syndrome of bilateral lacrimal glands: Secondary | ICD-10-CM | POA: Diagnosis not present

## 2016-04-18 ENCOUNTER — Other Ambulatory Visit: Payer: Self-pay | Admitting: Gastroenterology

## 2016-04-19 DIAGNOSIS — T753XXA Motion sickness, initial encounter: Secondary | ICD-10-CM | POA: Diagnosis not present

## 2016-04-19 DIAGNOSIS — Z0000 Encounter for general adult medical examination without abnormal findings: Secondary | ICD-10-CM | POA: Diagnosis not present

## 2016-04-19 DIAGNOSIS — Z79899 Other long term (current) drug therapy: Secondary | ICD-10-CM | POA: Diagnosis not present

## 2016-04-19 DIAGNOSIS — R739 Hyperglycemia, unspecified: Secondary | ICD-10-CM | POA: Diagnosis not present

## 2016-04-19 DIAGNOSIS — F329 Major depressive disorder, single episode, unspecified: Secondary | ICD-10-CM | POA: Diagnosis not present

## 2016-04-19 DIAGNOSIS — Z6829 Body mass index (BMI) 29.0-29.9, adult: Secondary | ICD-10-CM | POA: Diagnosis not present

## 2016-04-19 DIAGNOSIS — F419 Anxiety disorder, unspecified: Secondary | ICD-10-CM | POA: Diagnosis not present

## 2016-04-19 DIAGNOSIS — E784 Other hyperlipidemia: Secondary | ICD-10-CM | POA: Diagnosis not present

## 2016-04-19 DIAGNOSIS — Z1389 Encounter for screening for other disorder: Secondary | ICD-10-CM | POA: Diagnosis not present

## 2016-04-19 DIAGNOSIS — I10 Essential (primary) hypertension: Secondary | ICD-10-CM | POA: Diagnosis not present

## 2016-04-19 DIAGNOSIS — Z23 Encounter for immunization: Secondary | ICD-10-CM | POA: Diagnosis not present

## 2016-04-19 DIAGNOSIS — E559 Vitamin D deficiency, unspecified: Secondary | ICD-10-CM | POA: Diagnosis not present

## 2016-04-19 DIAGNOSIS — E669 Obesity, unspecified: Secondary | ICD-10-CM | POA: Diagnosis not present

## 2016-04-26 DIAGNOSIS — Z85828 Personal history of other malignant neoplasm of skin: Secondary | ICD-10-CM | POA: Diagnosis not present

## 2016-04-26 DIAGNOSIS — L570 Actinic keratosis: Secondary | ICD-10-CM | POA: Diagnosis not present

## 2016-04-26 DIAGNOSIS — D229 Melanocytic nevi, unspecified: Secondary | ICD-10-CM | POA: Diagnosis not present

## 2016-05-10 DIAGNOSIS — N390 Urinary tract infection, site not specified: Secondary | ICD-10-CM | POA: Diagnosis not present

## 2016-06-25 ENCOUNTER — Other Ambulatory Visit: Payer: Self-pay | Admitting: Gastroenterology

## 2016-06-28 ENCOUNTER — Encounter (HOSPITAL_COMMUNITY): Payer: Self-pay | Admitting: *Deleted

## 2016-07-01 ENCOUNTER — Ambulatory Visit (HOSPITAL_COMMUNITY): Admission: RE | Admit: 2016-07-01 | Payer: Medicare Other | Source: Ambulatory Visit | Admitting: Gastroenterology

## 2016-07-01 ENCOUNTER — Ambulatory Visit (HOSPITAL_COMMUNITY): Payer: Medicare Other | Admitting: Anesthesiology

## 2016-07-01 ENCOUNTER — Encounter (HOSPITAL_COMMUNITY): Admission: RE | Payer: Self-pay | Source: Ambulatory Visit

## 2016-07-01 ENCOUNTER — Ambulatory Visit (HOSPITAL_COMMUNITY)
Admission: RE | Admit: 2016-07-01 | Discharge: 2016-07-01 | Disposition: A | Discharge status: Home / Self Care | Payer: Medicare Other | Source: Ambulatory Visit | Attending: Gastroenterology | Admitting: Gastroenterology

## 2016-07-01 ENCOUNTER — Encounter (HOSPITAL_COMMUNITY): Payer: Self-pay | Admitting: Emergency Medicine

## 2016-07-01 ENCOUNTER — Encounter (HOSPITAL_COMMUNITY)
Admission: RE | Disposition: A | Discharge status: Home / Self Care | Payer: Self-pay | Source: Ambulatory Visit | Attending: Gastroenterology

## 2016-07-01 ENCOUNTER — Encounter (HOSPITAL_COMMUNITY): Payer: Self-pay

## 2016-07-01 DIAGNOSIS — Z79899 Other long term (current) drug therapy: Secondary | ICD-10-CM | POA: Insufficient documentation

## 2016-07-01 DIAGNOSIS — K219 Gastro-esophageal reflux disease without esophagitis: Secondary | ICD-10-CM | POA: Diagnosis not present

## 2016-07-01 DIAGNOSIS — F419 Anxiety disorder, unspecified: Secondary | ICD-10-CM | POA: Diagnosis not present

## 2016-07-01 DIAGNOSIS — I10 Essential (primary) hypertension: Secondary | ICD-10-CM | POA: Insufficient documentation

## 2016-07-01 DIAGNOSIS — Z1211 Encounter for screening for malignant neoplasm of colon: Secondary | ICD-10-CM | POA: Insufficient documentation

## 2016-07-01 HISTORY — PX: COLONOSCOPY WITH PROPOFOL: SHX5780

## 2016-07-01 HISTORY — DX: Other complications of anesthesia, initial encounter: T88.59XA

## 2016-07-01 HISTORY — DX: Anxiety disorder, unspecified: F41.9

## 2016-07-01 HISTORY — DX: Gastro-esophageal reflux disease without esophagitis: K21.9

## 2016-07-01 HISTORY — DX: Nausea with vomiting, unspecified: R11.2

## 2016-07-01 HISTORY — DX: Essential (primary) hypertension: I10

## 2016-07-01 HISTORY — DX: Adverse effect of unspecified anesthetic, initial encounter: T41.45XA

## 2016-07-01 HISTORY — DX: Other specified postprocedural states: Z98.890

## 2016-07-01 SURGERY — COLONOSCOPY WITH PROPOFOL
Anesthesia: Monitor Anesthesia Care

## 2016-07-01 MED ORDER — PROPOFOL 10 MG/ML IV BOLUS
INTRAVENOUS | Status: DC | PRN
Start: 2016-07-01 — End: 2016-07-01
  Administered 2016-07-01 (×12): 20 mg via INTRAVENOUS

## 2016-07-01 MED ORDER — ONDANSETRON HCL 4 MG/2ML IJ SOLN
INTRAMUSCULAR | Status: DC | PRN
  Administered 2016-07-01: 4 mg via INTRAVENOUS

## 2016-07-01 MED ORDER — PROPOFOL 10 MG/ML IV BOLUS
INTRAVENOUS | Status: AC
  Filled 2016-07-01: qty 40

## 2016-07-01 MED ORDER — ONDANSETRON HCL 4 MG/2ML IJ SOLN
INTRAMUSCULAR | Status: AC
  Filled 2016-07-01: qty 2

## 2016-07-01 MED ORDER — LACTATED RINGERS IV SOLN
INTRAVENOUS | Status: DC
  Administered 2016-07-01: 1000 mL via INTRAVENOUS

## 2016-07-01 SURGICAL SUPPLY — 22 items

## 2016-07-01 NOTE — H&P (Signed)
Procedure: Screening colonoscopy. 05/26/2006 normal screening colonoscopy was performed  History: The patient is a 76 year old female born August 12, 1940. She is scheduled to undergo a repeat screening colonoscopy today.  Past medical history: Allergic rhinitis. Hypercholesterolemia. Cholecystectomy. Sinus surgery. Ankle fracture surgery left ankle.  Medication allergies: Losartan-hydrochlorothiazide  Exam: The patient is alert and lying comfortably on the endoscopy stretcher. Abdomen is soft and nontender to palpation. Lungs are clear to auscultation. Cardiac exam reveals a regular rhythm.  Plan: Proceed with screening colonoscopy

## 2016-07-01 NOTE — Op Note (Signed)
[facility] Patient Name: Kristy Hebert Procedure Date: 07/01/2016 MRN: [id number] Attending MD: Garlan Fair , MD Date of Birth: [birth year] CSN: [id number] Age: 76 Admit Type: Outpatient Procedure:                Colonoscopy Indications:              Screening for colorectal malignant neoplasm.                            05/26/2006 normal screening colonoscopy was                            performed. Providers:                Garlan Fair, MD, Kingsley Plan, RN, Lillie Fragmin, RN, Tinnie Gens, Technician Referring MD:              Medicines:                Propofol per Anesthesia Complications:            No immediate complications. Estimated Blood Loss:     Estimated blood loss: none. Procedure:                Pre-Anesthesia Assessment:                           - Prior to the procedure, a History and Physical                            was performed, and patient medications and                            allergies were reviewed. The patient's tolerance of                            previous anesthesia was also reviewed. The risks                            and benefits of the procedure and the sedation                            options and risks were discussed with the patient.                            All questions were answered, and informed consent                            was obtained. Prior Anticoagulants: The patient has                            taken no previous anticoagulant or antiplatelet                            agents. ASA Grade Assessment: II -  A patient with                            mild systemic disease. After reviewing the risks                            and benefits, the patient was deemed in                            satisfactory condition to undergo the procedure.                           After obtaining informed consent, the colonoscope                            was passed under direct  vision. Throughout the                            procedure, the patient's blood pressure, pulse, and                            oxygen saturations were monitored continuously. The                            EC-[id number] ([id number]) scope was introduced through                            the anus and advanced to the the cecum, identified                            by appendiceal orifice and ileocecal valve. The                            colonoscopy was performed without difficulty. The                            patient tolerated the procedure well. The quality                            of the bowel preparation was good. The appendiceal                            orifice and the rectum were photographed. Scope In: 9:27:25 AM Scope Out: 9:47:42 AM Scope Withdrawal Time: 0 hours 7 minutes 40 seconds  Total Procedure Duration: 0 hours 20 minutes 17 seconds  Findings:      The perianal and digital rectal examinations were normal.      The entire examined colon appeared normal. Impression:               - The entire examined colon is normal.                           - No specimens collected. Moderate Sedation:      N/A- Per Anesthesia Care Recommendation:           -  Patient has a contact number available for                            emergencies. The signs and symptoms of potential                            delayed complications were discussed with the                            patient. Return to normal activities tomorrow.                            Written discharge instructions were provided to the                            patient.                           - Repeat colonoscopy is not recommended for                            screening purposes.                           - Resume previous diet.                           - Continue present medications. Procedure Code(s):        --- Professional ---                           O7096, Colorectal cancer screening; colonoscopy on                             individual not meeting criteria for high risk Diagnosis Code(s):        --- Professional ---                           Z12.11, Encounter for screening for malignant                            neoplasm of colon CPT copyright 2016 American Medical Association. All rights reserved. The codes documented in this report are preliminary and upon coder review may  be revised to meet current compliance requirements. Earle Gell, MD Garlan Fair, MD 07/01/2016 9:54:02 AM This report has been signed electronically. Number of Addenda: 0

## 2016-07-01 NOTE — Anesthesia Preprocedure Evaluation (Addendum)
Anesthesia Evaluation  Patient identified by MRN, date of birth, ID band Patient awake    History of Anesthesia Complications (+) PONV and history of anesthetic complications  Airway Mallampati: II       Dental  (+) Upper Dentures   Pulmonary neg pulmonary ROS,    Pulmonary exam normal        Cardiovascular hypertension, Pt. on home beta blockers Normal cardiovascular exam     Neuro/Psych Anxiety negative neurological ROS     GI/Hepatic Neg liver ROS, GERD  Medicated,  Endo/Other  negative endocrine ROS  Renal/GU negative Renal ROS  negative genitourinary   Musculoskeletal negative musculoskeletal ROS (+)   Abdominal Normal abdominal exam  (+)   Peds negative pediatric ROS (+)  Hematology negative hematology ROS (+)   Anesthesia Other Findings   Reproductive/Obstetrics negative OB ROS                            Anesthesia Physical Anesthesia Plan  ASA: II  Anesthesia Plan: MAC   Post-op Pain Management:    Induction: Intravenous  PONV Risk Score and Plan: 0 and Propofol  Airway Management Planned:   Additional Equipment:   Intra-op Plan:   Post-operative Plan:   Informed Consent: I have reviewed the patients History and Physical, chart, labs and discussed the procedure including the risks, benefits and alternatives for the proposed anesthesia with the patient or authorized representative who has indicated his/her understanding and acceptance.     Plan Discussed with: CRNA  Anesthesia Plan Comments:         Anesthesia Quick Evaluation

## 2016-07-01 NOTE — Transfer of Care (Signed)
Immediate Anesthesia Transfer of Care Note  Patient: Kristy Hebert  Procedure(s) Performed: Procedure(s): COLONOSCOPY WITH PROPOFOL (N/A)  Patient Location: PACU  Anesthesia Type:MAC  Level of Consciousness:  sedated, patient cooperative and responds to stimulation  Airway & Oxygen Therapy:Patient Spontanous Breathing and Patient connected to face mask oxgen  Post-op Assessment:  Report given to PACU RN and Post -op Vital signs reviewed and stable  Post vital signs:  Reviewed and stable  Last Vitals:  Vitals:   07/01/16 0741  BP: (!) 174/53  Pulse: 62  Resp: 18  Temp: 90.3 C    Complications: No apparent anesthesia complications

## 2016-07-01 NOTE — Discharge Instructions (Signed)

## 2016-07-01 NOTE — Anesthesia Postprocedure Evaluation (Signed)
Anesthesia Post Note  Patient: Kristy Hebert  Procedure(s) Performed: Procedure(s) (LRB): COLONOSCOPY WITH PROPOFOL (N/A)     Patient location during evaluation: PACU Anesthesia Type: MAC Level of consciousness: awake and alert Pain management: pain level controlled Vital Signs Assessment: post-procedure vital signs reviewed and stable Respiratory status: spontaneous breathing, nonlabored ventilation, respiratory function stable and patient connected to nasal cannula oxygen Cardiovascular status: stable and blood pressure returned to baseline Anesthetic complications: no    Last Vitals:  Vitals:   07/01/16 0741 07/01/16 0953  BP: (!) 174/53 (!) 124/39  Pulse: 62 (!) 50  Resp: 18 17  Temp: 36.7 C     Last Pain:  Vitals:   07/01/16 0741  TempSrc: Oral                 Effie Berkshire

## 2016-07-03 ENCOUNTER — Encounter (HOSPITAL_COMMUNITY): Payer: Self-pay | Admitting: Gastroenterology

## 2016-09-30 DIAGNOSIS — Z23 Encounter for immunization: Secondary | ICD-10-CM | POA: Diagnosis not present

## 2016-10-01 DIAGNOSIS — H401131 Primary open-angle glaucoma, bilateral, mild stage: Secondary | ICD-10-CM | POA: Diagnosis not present

## 2016-10-01 DIAGNOSIS — H04123 Dry eye syndrome of bilateral lacrimal glands: Secondary | ICD-10-CM | POA: Diagnosis not present

## 2016-11-04 ENCOUNTER — Other Ambulatory Visit: Payer: Self-pay | Admitting: Internal Medicine

## 2016-11-04 DIAGNOSIS — Z1231 Encounter for screening mammogram for malignant neoplasm of breast: Secondary | ICD-10-CM

## 2016-12-13 ENCOUNTER — Ambulatory Visit
Admission: RE | Admit: 2016-12-13 | Discharge: 2016-12-13 | Disposition: A | Discharge status: Home / Self Care | Payer: Medicare PPO | Source: Ambulatory Visit | Attending: Internal Medicine | Admitting: Internal Medicine

## 2016-12-13 DIAGNOSIS — Z1231 Encounter for screening mammogram for malignant neoplasm of breast: Secondary | ICD-10-CM

## 2017-09-02 DIAGNOSIS — H2513 Age-related nuclear cataract, bilateral: Secondary | ICD-10-CM | POA: Diagnosis not present

## 2017-09-02 DIAGNOSIS — H43813 Vitreous degeneration, bilateral: Secondary | ICD-10-CM | POA: Diagnosis not present

## 2017-09-02 DIAGNOSIS — H401131 Primary open-angle glaucoma, bilateral, mild stage: Secondary | ICD-10-CM | POA: Diagnosis not present

## 2017-09-02 DIAGNOSIS — H524 Presbyopia: Secondary | ICD-10-CM | POA: Diagnosis not present

## 2017-10-14 ENCOUNTER — Other Ambulatory Visit: Payer: Self-pay | Admitting: Internal Medicine

## 2017-10-14 DIAGNOSIS — Z1231 Encounter for screening mammogram for malignant neoplasm of breast: Secondary | ICD-10-CM

## 2017-12-15 ENCOUNTER — Ambulatory Visit
Admission: RE | Admit: 2017-12-15 | Discharge: 2017-12-15 | Disposition: A | Discharge status: Home / Self Care | Payer: Medicare PPO | Source: Ambulatory Visit | Attending: Internal Medicine | Admitting: Internal Medicine

## 2017-12-15 DIAGNOSIS — Z1231 Encounter for screening mammogram for malignant neoplasm of breast: Secondary | ICD-10-CM

## 2018-03-10 DIAGNOSIS — H401131 Primary open-angle glaucoma, bilateral, mild stage: Secondary | ICD-10-CM | POA: Diagnosis not present

## 2018-03-23 DIAGNOSIS — H5203 Hypermetropia, bilateral: Secondary | ICD-10-CM | POA: Diagnosis not present

## 2018-03-23 DIAGNOSIS — H401131 Primary open-angle glaucoma, bilateral, mild stage: Secondary | ICD-10-CM | POA: Diagnosis not present

## 2018-03-23 DIAGNOSIS — H25013 Cortical age-related cataract, bilateral: Secondary | ICD-10-CM | POA: Diagnosis not present

## 2018-03-23 DIAGNOSIS — H2513 Age-related nuclear cataract, bilateral: Secondary | ICD-10-CM | POA: Diagnosis not present

## 2018-05-21 DIAGNOSIS — H25011 Cortical age-related cataract, right eye: Secondary | ICD-10-CM | POA: Diagnosis not present

## 2018-05-21 DIAGNOSIS — H409 Unspecified glaucoma: Secondary | ICD-10-CM | POA: Diagnosis not present

## 2018-05-21 DIAGNOSIS — H401111 Primary open-angle glaucoma, right eye, mild stage: Secondary | ICD-10-CM | POA: Diagnosis not present

## 2018-05-21 DIAGNOSIS — H2511 Age-related nuclear cataract, right eye: Secondary | ICD-10-CM | POA: Diagnosis not present

## 2018-05-21 DIAGNOSIS — H25811 Combined forms of age-related cataract, right eye: Secondary | ICD-10-CM | POA: Diagnosis not present

## 2018-06-11 DIAGNOSIS — H2512 Age-related nuclear cataract, left eye: Secondary | ICD-10-CM | POA: Diagnosis not present

## 2018-06-11 DIAGNOSIS — H25812 Combined forms of age-related cataract, left eye: Secondary | ICD-10-CM | POA: Diagnosis not present

## 2018-06-11 DIAGNOSIS — H25012 Cortical age-related cataract, left eye: Secondary | ICD-10-CM | POA: Diagnosis not present

## 2018-06-11 DIAGNOSIS — H401120 Primary open-angle glaucoma, left eye, stage unspecified: Secondary | ICD-10-CM | POA: Diagnosis not present

## 2018-06-11 DIAGNOSIS — H401122 Primary open-angle glaucoma, left eye, moderate stage: Secondary | ICD-10-CM | POA: Diagnosis not present

## 2018-06-11 DIAGNOSIS — H409 Unspecified glaucoma: Secondary | ICD-10-CM | POA: Diagnosis not present

## 2018-06-23 DIAGNOSIS — Z1389 Encounter for screening for other disorder: Secondary | ICD-10-CM | POA: Diagnosis not present

## 2018-06-23 DIAGNOSIS — F419 Anxiety disorder, unspecified: Secondary | ICD-10-CM | POA: Diagnosis not present

## 2018-06-23 DIAGNOSIS — I10 Essential (primary) hypertension: Secondary | ICD-10-CM | POA: Diagnosis not present

## 2018-06-23 DIAGNOSIS — H409 Unspecified glaucoma: Secondary | ICD-10-CM | POA: Diagnosis not present

## 2018-06-23 DIAGNOSIS — Z0001 Encounter for general adult medical examination with abnormal findings: Secondary | ICD-10-CM | POA: Diagnosis not present

## 2018-08-05 DIAGNOSIS — Z961 Presence of intraocular lens: Secondary | ICD-10-CM | POA: Diagnosis not present

## 2018-09-28 DIAGNOSIS — Z79899 Other long term (current) drug therapy: Secondary | ICD-10-CM | POA: Diagnosis not present

## 2018-09-28 DIAGNOSIS — F322 Major depressive disorder, single episode, severe without psychotic features: Secondary | ICD-10-CM | POA: Diagnosis not present

## 2018-09-28 DIAGNOSIS — I10 Essential (primary) hypertension: Secondary | ICD-10-CM | POA: Diagnosis not present

## 2018-09-28 DIAGNOSIS — R7309 Other abnormal glucose: Secondary | ICD-10-CM | POA: Diagnosis not present

## 2018-09-28 DIAGNOSIS — F419 Anxiety disorder, unspecified: Secondary | ICD-10-CM | POA: Diagnosis not present

## 2018-09-28 DIAGNOSIS — H409 Unspecified glaucoma: Secondary | ICD-10-CM | POA: Diagnosis not present

## 2018-09-28 DIAGNOSIS — E559 Vitamin D deficiency, unspecified: Secondary | ICD-10-CM | POA: Diagnosis not present

## 2018-09-28 DIAGNOSIS — E785 Hyperlipidemia, unspecified: Secondary | ICD-10-CM | POA: Diagnosis not present

## 2018-10-13 ENCOUNTER — Other Ambulatory Visit: Payer: Self-pay | Admitting: Internal Medicine

## 2018-10-13 DIAGNOSIS — Z1231 Encounter for screening mammogram for malignant neoplasm of breast: Secondary | ICD-10-CM

## 2018-11-11 DIAGNOSIS — I10 Essential (primary) hypertension: Secondary | ICD-10-CM | POA: Diagnosis not present

## 2018-11-11 DIAGNOSIS — F419 Anxiety disorder, unspecified: Secondary | ICD-10-CM | POA: Diagnosis not present

## 2018-11-20 DIAGNOSIS — F419 Anxiety disorder, unspecified: Secondary | ICD-10-CM | POA: Diagnosis not present

## 2018-11-20 DIAGNOSIS — I10 Essential (primary) hypertension: Secondary | ICD-10-CM | POA: Diagnosis not present

## 2018-12-01 DIAGNOSIS — H401131 Primary open-angle glaucoma, bilateral, mild stage: Secondary | ICD-10-CM | POA: Diagnosis not present

## 2018-12-17 ENCOUNTER — Ambulatory Visit
Admission: RE | Admit: 2018-12-17 | Discharge: 2018-12-17 | Disposition: A | Discharge status: Home / Self Care | Payer: Medicare PPO | Source: Ambulatory Visit | Attending: Internal Medicine | Admitting: Internal Medicine

## 2018-12-17 ENCOUNTER — Other Ambulatory Visit: Payer: Self-pay

## 2018-12-17 DIAGNOSIS — Z1231 Encounter for screening mammogram for malignant neoplasm of breast: Secondary | ICD-10-CM | POA: Diagnosis not present

## 2019-01-04 DIAGNOSIS — F419 Anxiety disorder, unspecified: Secondary | ICD-10-CM | POA: Diagnosis not present

## 2019-01-04 DIAGNOSIS — I10 Essential (primary) hypertension: Secondary | ICD-10-CM | POA: Diagnosis not present

## 2019-01-04 DIAGNOSIS — R197 Diarrhea, unspecified: Secondary | ICD-10-CM | POA: Diagnosis not present

## 2019-01-15 HISTORY — PX: CATARACT EXTRACTION, BILATERAL: SHX1313

## 2019-01-25 DIAGNOSIS — R197 Diarrhea, unspecified: Secondary | ICD-10-CM | POA: Diagnosis not present

## 2019-01-25 DIAGNOSIS — I10 Essential (primary) hypertension: Secondary | ICD-10-CM | POA: Diagnosis not present

## 2019-01-25 DIAGNOSIS — F419 Anxiety disorder, unspecified: Secondary | ICD-10-CM | POA: Diagnosis not present

## 2019-06-02 DIAGNOSIS — H401131 Primary open-angle glaucoma, bilateral, mild stage: Secondary | ICD-10-CM | POA: Diagnosis not present

## 2019-06-02 DIAGNOSIS — H43813 Vitreous degeneration, bilateral: Secondary | ICD-10-CM | POA: Diagnosis not present

## 2019-06-02 DIAGNOSIS — H04123 Dry eye syndrome of bilateral lacrimal glands: Secondary | ICD-10-CM | POA: Diagnosis not present

## 2019-06-02 DIAGNOSIS — H0100A Unspecified blepharitis right eye, upper and lower eyelids: Secondary | ICD-10-CM | POA: Diagnosis not present

## 2019-06-28 DIAGNOSIS — H409 Unspecified glaucoma: Secondary | ICD-10-CM | POA: Diagnosis not present

## 2019-06-28 DIAGNOSIS — K219 Gastro-esophageal reflux disease without esophagitis: Secondary | ICD-10-CM | POA: Diagnosis not present

## 2019-06-28 DIAGNOSIS — Z1389 Encounter for screening for other disorder: Secondary | ICD-10-CM | POA: Diagnosis not present

## 2019-06-28 DIAGNOSIS — I10 Essential (primary) hypertension: Secondary | ICD-10-CM | POA: Diagnosis not present

## 2019-06-28 DIAGNOSIS — F322 Major depressive disorder, single episode, severe without psychotic features: Secondary | ICD-10-CM | POA: Diagnosis not present

## 2019-06-28 DIAGNOSIS — Z79899 Other long term (current) drug therapy: Secondary | ICD-10-CM | POA: Diagnosis not present

## 2019-06-28 DIAGNOSIS — F419 Anxiety disorder, unspecified: Secondary | ICD-10-CM | POA: Diagnosis not present

## 2019-06-28 DIAGNOSIS — E559 Vitamin D deficiency, unspecified: Secondary | ICD-10-CM | POA: Diagnosis not present

## 2019-06-28 DIAGNOSIS — Z0001 Encounter for general adult medical examination with abnormal findings: Secondary | ICD-10-CM | POA: Diagnosis not present

## 2019-08-11 DIAGNOSIS — F419 Anxiety disorder, unspecified: Secondary | ICD-10-CM | POA: Diagnosis not present

## 2019-08-11 DIAGNOSIS — I10 Essential (primary) hypertension: Secondary | ICD-10-CM | POA: Diagnosis not present

## 2019-10-19 ENCOUNTER — Other Ambulatory Visit: Payer: Self-pay | Admitting: Internal Medicine

## 2019-10-19 DIAGNOSIS — Z1231 Encounter for screening mammogram for malignant neoplasm of breast: Secondary | ICD-10-CM

## 2019-11-24 DIAGNOSIS — H401131 Primary open-angle glaucoma, bilateral, mild stage: Secondary | ICD-10-CM | POA: Diagnosis not present

## 2019-12-20 ENCOUNTER — Ambulatory Visit
Admission: RE | Admit: 2019-12-20 | Discharge: 2019-12-20 | Disposition: A | Discharge status: Home / Self Care | Payer: Medicare PPO | Source: Ambulatory Visit | Attending: Internal Medicine | Admitting: Internal Medicine

## 2019-12-20 ENCOUNTER — Other Ambulatory Visit: Payer: Self-pay

## 2019-12-20 DIAGNOSIS — Z1231 Encounter for screening mammogram for malignant neoplasm of breast: Secondary | ICD-10-CM

## 2019-12-29 DIAGNOSIS — K219 Gastro-esophageal reflux disease without esophagitis: Secondary | ICD-10-CM | POA: Diagnosis not present

## 2019-12-29 DIAGNOSIS — I10 Essential (primary) hypertension: Secondary | ICD-10-CM | POA: Diagnosis not present

## 2019-12-29 DIAGNOSIS — Z79899 Other long term (current) drug therapy: Secondary | ICD-10-CM | POA: Diagnosis not present

## 2019-12-29 DIAGNOSIS — F419 Anxiety disorder, unspecified: Secondary | ICD-10-CM | POA: Diagnosis not present

## 2019-12-29 DIAGNOSIS — E559 Vitamin D deficiency, unspecified: Secondary | ICD-10-CM | POA: Diagnosis not present

## 2019-12-29 DIAGNOSIS — F322 Major depressive disorder, single episode, severe without psychotic features: Secondary | ICD-10-CM | POA: Diagnosis not present

## 2019-12-29 DIAGNOSIS — H409 Unspecified glaucoma: Secondary | ICD-10-CM | POA: Diagnosis not present

## 2020-05-30 DIAGNOSIS — H524 Presbyopia: Secondary | ICD-10-CM | POA: Diagnosis not present

## 2020-05-30 DIAGNOSIS — H04123 Dry eye syndrome of bilateral lacrimal glands: Secondary | ICD-10-CM | POA: Diagnosis not present

## 2020-05-30 DIAGNOSIS — H401131 Primary open-angle glaucoma, bilateral, mild stage: Secondary | ICD-10-CM | POA: Diagnosis not present

## 2020-05-30 DIAGNOSIS — H0100A Unspecified blepharitis right eye, upper and lower eyelids: Secondary | ICD-10-CM | POA: Diagnosis not present

## 2020-06-28 DIAGNOSIS — I10 Essential (primary) hypertension: Secondary | ICD-10-CM | POA: Diagnosis not present

## 2020-06-28 DIAGNOSIS — E559 Vitamin D deficiency, unspecified: Secondary | ICD-10-CM | POA: Diagnosis not present

## 2020-06-28 DIAGNOSIS — F322 Major depressive disorder, single episode, severe without psychotic features: Secondary | ICD-10-CM | POA: Diagnosis not present

## 2020-06-28 DIAGNOSIS — K219 Gastro-esophageal reflux disease without esophagitis: Secondary | ICD-10-CM | POA: Diagnosis not present

## 2020-06-28 DIAGNOSIS — F419 Anxiety disorder, unspecified: Secondary | ICD-10-CM | POA: Diagnosis not present

## 2020-06-28 DIAGNOSIS — H409 Unspecified glaucoma: Secondary | ICD-10-CM | POA: Diagnosis not present

## 2020-06-28 DIAGNOSIS — Z0001 Encounter for general adult medical examination with abnormal findings: Secondary | ICD-10-CM | POA: Diagnosis not present

## 2020-06-28 DIAGNOSIS — Z79899 Other long term (current) drug therapy: Secondary | ICD-10-CM | POA: Diagnosis not present

## 2020-09-26 ENCOUNTER — Other Ambulatory Visit: Payer: Self-pay | Admitting: Internal Medicine

## 2020-09-26 DIAGNOSIS — Z1231 Encounter for screening mammogram for malignant neoplasm of breast: Secondary | ICD-10-CM

## 2020-11-21 ENCOUNTER — Ambulatory Visit: Payer: Medicare PPO | Admitting: Dermatology

## 2020-12-11 DIAGNOSIS — H401131 Primary open-angle glaucoma, bilateral, mild stage: Secondary | ICD-10-CM | POA: Diagnosis not present

## 2020-12-11 DIAGNOSIS — H04123 Dry eye syndrome of bilateral lacrimal glands: Secondary | ICD-10-CM | POA: Diagnosis not present

## 2020-12-20 ENCOUNTER — Ambulatory Visit
Admission: RE | Admit: 2020-12-20 | Discharge: 2020-12-20 | Disposition: A | Discharge status: Home / Self Care | Payer: Medicare PPO | Source: Ambulatory Visit | Attending: Internal Medicine | Admitting: Internal Medicine

## 2020-12-20 DIAGNOSIS — Z1231 Encounter for screening mammogram for malignant neoplasm of breast: Secondary | ICD-10-CM

## 2021-06-12 DIAGNOSIS — H524 Presbyopia: Secondary | ICD-10-CM | POA: Diagnosis not present

## 2021-06-12 DIAGNOSIS — Z961 Presence of intraocular lens: Secondary | ICD-10-CM | POA: Diagnosis not present

## 2021-06-12 DIAGNOSIS — H401131 Primary open-angle glaucoma, bilateral, mild stage: Secondary | ICD-10-CM | POA: Diagnosis not present

## 2021-06-12 DIAGNOSIS — H04123 Dry eye syndrome of bilateral lacrimal glands: Secondary | ICD-10-CM | POA: Diagnosis not present

## 2021-07-03 DIAGNOSIS — Z79899 Other long term (current) drug therapy: Secondary | ICD-10-CM | POA: Diagnosis not present

## 2021-07-03 DIAGNOSIS — E559 Vitamin D deficiency, unspecified: Secondary | ICD-10-CM | POA: Diagnosis not present

## 2021-07-03 DIAGNOSIS — Z1331 Encounter for screening for depression: Secondary | ICD-10-CM | POA: Diagnosis not present

## 2021-07-03 DIAGNOSIS — H409 Unspecified glaucoma: Secondary | ICD-10-CM | POA: Diagnosis not present

## 2021-07-03 DIAGNOSIS — Z0000 Encounter for general adult medical examination without abnormal findings: Secondary | ICD-10-CM | POA: Diagnosis not present

## 2021-07-03 DIAGNOSIS — I10 Essential (primary) hypertension: Secondary | ICD-10-CM | POA: Diagnosis not present

## 2021-07-03 DIAGNOSIS — K219 Gastro-esophageal reflux disease without esophagitis: Secondary | ICD-10-CM | POA: Diagnosis not present

## 2021-07-03 DIAGNOSIS — R531 Weakness: Secondary | ICD-10-CM | POA: Diagnosis not present

## 2021-07-03 DIAGNOSIS — F322 Major depressive disorder, single episode, severe without psychotic features: Secondary | ICD-10-CM | POA: Diagnosis not present

## 2021-07-03 DIAGNOSIS — F419 Anxiety disorder, unspecified: Secondary | ICD-10-CM | POA: Diagnosis not present

## 2021-07-03 DIAGNOSIS — Z5181 Encounter for therapeutic drug level monitoring: Secondary | ICD-10-CM | POA: Diagnosis not present

## 2021-08-15 DIAGNOSIS — R42 Dizziness and giddiness: Secondary | ICD-10-CM | POA: Diagnosis not present

## 2021-08-15 DIAGNOSIS — I10 Essential (primary) hypertension: Secondary | ICD-10-CM | POA: Diagnosis not present

## 2021-08-20 DIAGNOSIS — F419 Anxiety disorder, unspecified: Secondary | ICD-10-CM | POA: Diagnosis not present

## 2021-08-20 DIAGNOSIS — I10 Essential (primary) hypertension: Secondary | ICD-10-CM | POA: Diagnosis not present

## 2021-09-03 DIAGNOSIS — F419 Anxiety disorder, unspecified: Secondary | ICD-10-CM | POA: Diagnosis not present

## 2021-09-03 DIAGNOSIS — I10 Essential (primary) hypertension: Secondary | ICD-10-CM | POA: Diagnosis not present

## 2021-10-29 ENCOUNTER — Other Ambulatory Visit: Payer: Self-pay | Admitting: Internal Medicine

## 2021-10-29 DIAGNOSIS — Z1231 Encounter for screening mammogram for malignant neoplasm of breast: Secondary | ICD-10-CM

## 2021-12-18 DIAGNOSIS — H04123 Dry eye syndrome of bilateral lacrimal glands: Secondary | ICD-10-CM | POA: Diagnosis not present

## 2021-12-18 DIAGNOSIS — H401131 Primary open-angle glaucoma, bilateral, mild stage: Secondary | ICD-10-CM | POA: Diagnosis not present

## 2021-12-21 ENCOUNTER — Ambulatory Visit
Admission: RE | Admit: 2021-12-21 | Discharge: 2021-12-21 | Disposition: A | Discharge status: Home / Self Care | Payer: Medicare PPO | Source: Ambulatory Visit | Attending: Internal Medicine | Admitting: Internal Medicine

## 2021-12-21 DIAGNOSIS — Z1231 Encounter for screening mammogram for malignant neoplasm of breast: Secondary | ICD-10-CM

## 2022-04-18 DIAGNOSIS — F5101 Primary insomnia: Secondary | ICD-10-CM | POA: Diagnosis not present

## 2022-04-18 DIAGNOSIS — J302 Other seasonal allergic rhinitis: Secondary | ICD-10-CM | POA: Diagnosis not present

## 2022-04-18 DIAGNOSIS — I10 Essential (primary) hypertension: Secondary | ICD-10-CM | POA: Diagnosis not present

## 2022-04-18 DIAGNOSIS — F419 Anxiety disorder, unspecified: Secondary | ICD-10-CM | POA: Diagnosis not present

## 2022-04-25 DIAGNOSIS — I10 Essential (primary) hypertension: Secondary | ICD-10-CM | POA: Diagnosis not present

## 2022-04-25 DIAGNOSIS — F419 Anxiety disorder, unspecified: Secondary | ICD-10-CM | POA: Diagnosis not present

## 2022-04-25 DIAGNOSIS — G4700 Insomnia, unspecified: Secondary | ICD-10-CM | POA: Diagnosis not present

## 2022-05-06 ENCOUNTER — Other Ambulatory Visit: Payer: Self-pay | Admitting: Internal Medicine

## 2022-05-06 DIAGNOSIS — I10 Essential (primary) hypertension: Secondary | ICD-10-CM | POA: Diagnosis not present

## 2022-05-13 DIAGNOSIS — R739 Hyperglycemia, unspecified: Secondary | ICD-10-CM | POA: Diagnosis not present

## 2022-05-13 DIAGNOSIS — F322 Major depressive disorder, single episode, severe without psychotic features: Secondary | ICD-10-CM | POA: Diagnosis not present

## 2022-05-13 DIAGNOSIS — F419 Anxiety disorder, unspecified: Secondary | ICD-10-CM | POA: Diagnosis not present

## 2022-05-13 DIAGNOSIS — I10 Essential (primary) hypertension: Secondary | ICD-10-CM | POA: Diagnosis not present

## 2022-05-16 ENCOUNTER — Ambulatory Visit: Payer: Medicare PPO | Admitting: Family Medicine

## 2022-05-21 ENCOUNTER — Ambulatory Visit
Admission: RE | Admit: 2022-05-21 | Discharge: 2022-05-21 | Disposition: A | Discharge status: Home / Self Care | Payer: Medicare PPO | Source: Ambulatory Visit | Attending: Internal Medicine | Admitting: Internal Medicine

## 2022-05-21 DIAGNOSIS — I10 Essential (primary) hypertension: Secondary | ICD-10-CM

## 2022-06-04 DIAGNOSIS — R739 Hyperglycemia, unspecified: Secondary | ICD-10-CM | POA: Diagnosis not present

## 2022-06-04 DIAGNOSIS — F322 Major depressive disorder, single episode, severe without psychotic features: Secondary | ICD-10-CM | POA: Diagnosis not present

## 2022-06-04 DIAGNOSIS — I10 Essential (primary) hypertension: Secondary | ICD-10-CM | POA: Diagnosis not present

## 2022-06-04 DIAGNOSIS — F419 Anxiety disorder, unspecified: Secondary | ICD-10-CM | POA: Diagnosis not present

## 2022-06-19 DIAGNOSIS — I10 Essential (primary) hypertension: Secondary | ICD-10-CM | POA: Diagnosis not present

## 2022-06-19 DIAGNOSIS — F419 Anxiety disorder, unspecified: Secondary | ICD-10-CM | POA: Diagnosis not present

## 2022-06-19 DIAGNOSIS — R739 Hyperglycemia, unspecified: Secondary | ICD-10-CM | POA: Diagnosis not present

## 2022-06-19 DIAGNOSIS — R251 Tremor, unspecified: Secondary | ICD-10-CM | POA: Diagnosis not present

## 2022-06-19 LAB — HEMOGLOBIN A1C: A1c: 5.5

## 2022-06-21 DIAGNOSIS — H43813 Vitreous degeneration, bilateral: Secondary | ICD-10-CM | POA: Diagnosis not present

## 2022-06-21 DIAGNOSIS — H401131 Primary open-angle glaucoma, bilateral, mild stage: Secondary | ICD-10-CM | POA: Diagnosis not present

## 2022-06-21 DIAGNOSIS — H52203 Unspecified astigmatism, bilateral: Secondary | ICD-10-CM | POA: Diagnosis not present

## 2022-06-21 DIAGNOSIS — H524 Presbyopia: Secondary | ICD-10-CM | POA: Diagnosis not present

## 2022-06-21 DIAGNOSIS — I10 Essential (primary) hypertension: Secondary | ICD-10-CM | POA: Diagnosis not present

## 2022-06-21 DIAGNOSIS — H04123 Dry eye syndrome of bilateral lacrimal glands: Secondary | ICD-10-CM | POA: Diagnosis not present

## 2022-06-28 ENCOUNTER — Emergency Department (HOSPITAL_COMMUNITY)
Admission: EM | Admit: 2022-06-28 | Discharge: 2022-06-28 | Disposition: A | Discharge status: Home / Self Care | Payer: Medicare PPO | Attending: Emergency Medicine | Admitting: Emergency Medicine

## 2022-06-28 ENCOUNTER — Other Ambulatory Visit: Payer: Self-pay

## 2022-06-28 DIAGNOSIS — I10 Essential (primary) hypertension: Secondary | ICD-10-CM | POA: Diagnosis not present

## 2022-06-28 LAB — CBC
HCT: 38.5 % (ref 36.0–46.0)
Hemoglobin: 13 g/dL (ref 12.0–15.0)
MCH: 31.9 pg (ref 26.0–34.0)
MCHC: 33.8 g/dL (ref 30.0–36.0)
MCV: 94.4 fL (ref 80.0–100.0)
Platelets: 167 10*3/uL (ref 150–400)
RBC: 4.08 MIL/uL (ref 3.87–5.11)
RDW: 11.9 % (ref 11.5–15.5)
WBC: 6.3 10*3/uL (ref 4.0–10.5)
nRBC: 0 % (ref 0.0–0.2)

## 2022-06-28 LAB — BASIC METABOLIC PANEL
Anion gap: 8 (ref 5–15)
BUN: 15 mg/dL (ref 8–23)
CO2: 23 mmol/L (ref 22–32)
Calcium: 8.8 mg/dL — ABNORMAL LOW (ref 8.9–10.3)
Chloride: 102 mmol/L (ref 98–111)
Creatinine, Ser: 0.87 mg/dL (ref 0.44–1.00)
GFR, Estimated: >60 mL/min (ref >60)
Glucose, Bld: 160 mg/dL — ABNORMAL HIGH (ref 70–99)
Potassium: 3.8 mmol/L (ref 3.5–5.1)
Sodium: 133 mmol/L — ABNORMAL LOW (ref 135–145)

## 2022-06-28 LAB — HEPATIC FUNCTION PANEL
ALT: 18 U/L (ref 0–44)
AST: 23 U/L (ref 15–41)
Albumin: 3.7 g/dL (ref 3.5–5.0)
Alkaline Phosphatase: 49 U/L (ref 38–126)
Bilirubin, Direct: 0.2 mg/dL (ref 0.0–0.2)
Indirect Bilirubin: 0.1 mg/dL — ABNORMAL LOW (ref 0.3–0.9)
Total Bilirubin: 0.3 mg/dL (ref 0.3–1.2)
Total Protein: 5.6 g/dL — ABNORMAL LOW (ref 6.5–8.1)

## 2022-06-28 LAB — TROPONIN I (HIGH SENSITIVITY): Troponin I (High Sensitivity): 7 ng/L (ref <18)

## 2022-06-28 MED ORDER — CLONIDINE HCL 0.1 MG PO TABS: 0.1000 mg | ORAL_TABLET | Freq: Once | ORAL | Status: DC

## 2022-06-28 NOTE — ED Triage Notes (Signed)
Pt c/o HTN and generalized weakness since May; evaluated for same recently; endorses compliance with meds; was told to come to ED if BP went above 160 because "I have the high enzymes"; state she took took BP at home and was 175 systolic; denies pain, denies sob

## 2022-06-28 NOTE — ED Provider Notes (Signed)
[place] EMERGENCY DEPARTMENT AT [facility] Provider Note   HPI: Kristy Hebert is an 82 year old female with a past medical history as below presenting today with hypertension.  The patient reports she checked her blood pressure today and it was elevated above 160 mmHg.  She reports her PCP told her to come to the emergency department should her blood pressure be elevated over 160 mmHg.  She denies chest pain, shortness of breath.  She has not had abdominal pain, nausea, vomiting.  She denies headaches or dizziness.  She has been able to ambulate without unsteadiness or ataxia.  She has not had any confusion and has been at her mental status baseline.  She denies any other symptoms at this time.  Past Medical History:  Diagnosis Date   Anxiety    Complication of anesthesia    Depression    GERD (gastroesophageal reflux disease)    Glaucoma    Hyperglycemia    Hyperlipidemia    Hypertension    Insomnia    Obesity    PONV (postoperative nausea and vomiting)    with ankle surgery   Vitamin D deficiency     Past Surgical History:  Procedure Laterality Date   ANKLE SURGERY     CHOLECYSTECTOMY     COLONOSCOPY WITH PROPOFOL N/A 07/01/2016   Procedure: COLONOSCOPY WITH PROPOFOL;  Surgeon: Charolett Bumpers, MD;  Location: [facility] END[facility]COPY;  Service: Endoscopy;  Laterality: N/A;     Social History   Tobacco Use   Smoking status: Never   Smokeless tobacco: Never  Vaping Use   Vaping Use: Never used  Substance Use Topics   Alcohol use: No   Drug use: No      Review of Systems  A complete ROS was performed with pertinent positives/negatives noted in the HPI.   Vitals:   06/28/22 1745 06/28/22 1815  BP: (!) 130/51 (!) 137/58  Pulse: 61 74  Resp: 14 17  Temp:    SpO2: 96% 93%    Physical Exam Vitals and nursing note reviewed.  Constitutional:      General: She is not in acute distress.    Appearance: She is well-developed.  HENT:     Head:  Normocephalic and atraumatic.  Cardiovascular:     Rate and Rhythm: Normal rate and regular rhythm.     Pulses: Normal pulses.     Heart sounds: Normal heart sounds. No murmur heard.    No friction rub. No gallop.  Pulmonary:     Effort: Pulmonary effort is normal. No respiratory distress.     Breath sounds: Normal breath sounds. No stridor. No wheezing, rhonchi or rales.  Abdominal:     General: Abdomen is flat. There is no distension.     Palpations: Abdomen is soft.     Tenderness: There is no abdominal tenderness. There is no guarding or rebound.  Musculoskeletal:     Cervical back: Neck supple.  Skin:    General: Skin is warm and dry.     Capillary Refill: Capillary refill takes less than 2 seconds.  Neurological:     Mental Status: She is alert.     Procedures  MDM:   EKG results: ECG on my interpretation is normal sinus rhythm and rate, without anatomical ischemia representing STEMI, New-onset Arrhythmia, or ischemic equivalent.   Lab results:  Labs Reviewed  BASIC METABOLIC PANEL - Abnormal; Notable for the following components:      Result Value   Sodium  133 (*)    Glucose, Bld 160 (*)    Calcium 8.8 (*)    All other components within normal limits  HEPATIC FUNCTION PANEL - Abnormal; Notable for the following components:   Total Protein 5.6 (*)    Indirect Bilirubin 0.1 (*)    All other components within normal limits  CBC  TROPONIN I (HIGH SENSITIVITY)     Click here for ABCD2, HEART and other calculatorsREFRESH Note before signing   Medical decision making: -Vital signs stable. Patient afebrile, hemodynamically stable, and non-toxic appearing. -Patient's presentation is most consistent with acute complicated illness / injury requiring diagnostic workup.Marland Kitchen MARCIANNA BILLMEYER is a 82 y.o. female presenting to the emergency department with hypertension.  -Additional history obtained from spouse at bedside. -Per chart review, patient is on nifedipine and  carvedilol.  She is also reportedly on clonidine. -Patient has no chest pain, doubt ACS.  Patient is mentating appropriately without delirium, is not having any visual disturbances, doubt hypertensive encephalopathy.  Patient has clear breath sounds, no tachypnea or shortness of breath, doubt pulmonary edema.  Labs show no evidence of AKI.  Labs without transaminitis.  EKG without ischemic changes and troponin is normal, do not suspect ACS or endorgan cardiac dysfunction.  Overall history and exam unremarkable for any cause of hypertensive emergency.  Patient likely experiencing hypertensive urgency.  -Reassessment after resting in the ED shows BP improving without intervention. -Due to elevated BP without evidence of endorgan dysfunction, patient is stable for outpatient follow-up with PCP.  Follow-up plan discussed with the patient.  Strict return precautions discussed.  Instructed to take additional clonidine as needed for elevated blood pressure.  Patient discharged.    Medical Decision Making Amount and/or Complexity of Data Reviewed Labs: ordered. Decision-making details documented in ED Course. ECG/medicine tests: ordered and independent interpretation performed. Decision-making details documented in ED Course.  Risk Prescription drug management.     The plan for this patient was discussed with Dr. Silverio Lay, who voiced agreement and who oversaw evaluation and treatment of this patient.  Marta Lamas, MD Emergency Medicine, PGY-3  Note: Dragon medical dictation software was used in the creation of this note.   Clinical Impression:  1. Primary hypertension          Chase Caller, MD 06/28/22 1839    Charlynne Pander, MD 06/29/22 [phone]

## 2022-06-28 NOTE — ED Notes (Signed)
DC instructions reviewed with pt. PT verbalized understanding. Pt DC °

## 2022-06-28 NOTE — Discharge Instructions (Signed)
Kristy Hebert:  Thank you for allowing [place] to take care of you today.  We hope you begin feeling better soon.  To-Do: Please follow-up with your primary doctor. Continue your blood pressure medications as prescribed. Please return to the Emergency Department or call 911 if you experience chest pain, shortness of breath, severe pain, severe fever, altered mental status, or have any reason to think that you need emergency medical care.  Thank you again.  Hope you feel better soon.  Department of Emergency Medicine [facility]

## 2022-07-08 ENCOUNTER — Encounter: Payer: Self-pay | Admitting: Cardiology

## 2022-07-08 ENCOUNTER — Ambulatory Visit: Payer: Medicare PPO | Attending: Cardiology | Admitting: Cardiology

## 2022-07-08 VITALS — BP 154/72 | HR 74 | Ht 63.0 in | Wt 139.8 lb

## 2022-07-08 DIAGNOSIS — Z79899 Other long term (current) drug therapy: Secondary | ICD-10-CM | POA: Diagnosis not present

## 2022-07-08 DIAGNOSIS — I10 Essential (primary) hypertension: Secondary | ICD-10-CM

## 2022-07-08 MED ORDER — SPIRONOLACTONE 25 MG PO TABS: 25.0000 mg | ORAL_TABLET | Freq: Every day | ORAL | 3 refills | Status: DC

## 2022-07-08 NOTE — Progress Notes (Signed)
Cardiology Office Note:    Date:  07/08/2022   ID:  Kristy Hebert, Kristy Hebert 1940/01/16, MRN [id number]  PCP:  Marden Noble, MD (Inactive)   [facility] Providers Cardiologist:  Donato Schultz, MD     Referring MD: Marden Noble, MD     History of Present Illness:    Kristy Hebert is a 82 y.o. female here for the evaluation of hypertension at the request of Dr. Orson Aloe.  Previously she was told that if her blood pressure was over 160 she should probably go to the emergency department.  She has had multiple intolerances to blood pressure medications but she has been able to tolerate nifedipine and clonidine.  Lab work was normal/unremarkable.  Blood pressure came down to 120 with no medications in the emergency room but she took her own med from home.  She was instructed to take an extra clonidine if goes over 160.  Takes alprazolam to help sleep. Former patient of Dr. Kevan Ny  Feels dizziness.  Also feels hoarseness with her voice might be from clonidine Amlodipine made her vision more blurry, has glaucoma. Off atenolol because of bradycardia  Denies any chest pain shortness of breath.  No fevers chills nausea vomiting syncope.  Past Medical History:  Diagnosis Date   Anxiety    Complication of anesthesia    Depression    GERD (gastroesophageal reflux disease)    Glaucoma    Hyperglycemia    Hyperlipidemia    Hypertension    Insomnia    Obesity    PONV (postoperative nausea and vomiting)    with ankle surgery   Vitamin D deficiency     Past Surgical History:  Procedure Laterality Date   ANKLE SURGERY     CHOLECYSTECTOMY     COLONOSCOPY WITH PROPOFOL N/A 07/01/2016   Procedure: COLONOSCOPY WITH PROPOFOL;  Surgeon: Charolett Bumpers, MD;  Location: [facility];  Service: Endoscopy;  Laterality: N/A;    Current Medications: Current Meds  Medication Sig   ALPRAZolam (XANAX) 1 MG tablet Take 1.5 mg by mouth at bedtime as needed for anxiety.     ALPRAZolam (XANAX) 1 MG tablet Take 1.5 mg by mouth at bedtime.   alprazolam (XANAX) 2 MG tablet Take 2 mg by mouth 3 (three) times daily.   cloNIDine (CATAPRES) 0.1 MG tablet Take 0.1 mg by mouth 2 (two) times daily.   dorzolamide-timolol (COSOPT) 22.3-6.8 MG/ML ophthalmic solution Place 1 drop into both eyes 2 (two) times daily.   NIFEdipine (ADALAT CC) 30 MG 24 hr tablet Take 30 mg by mouth 2 (two) times daily.   spironolactone (ALDACTONE) 25 MG tablet Take 1 tablet (25 mg total) by mouth daily.   telmisartan (MICARDIS) 40 MG tablet Take 80 mg by mouth at bedtime. Pt take 2 tablets at bedtime.   TRAVATAN Z 0.004 % SOLN ophthalmic solution Place 1 drop into both eyes at bedtime.   Travoprost, BAK Free, (TRAVATAN) 0.004 % SOLN ophthalmic solution Place 1 drop into both eyes at bedtime.     Allergies:   Patient has no known allergies.   Social History   Socioeconomic History   Marital status: Married    Spouse name: Not on file   Number of children: Not on file   Years of education: Not on file   Highest education level: Not on file  Occupational History   Not on file  Tobacco Use   Smoking status: Never   Smokeless tobacco: Never  Vaping Use  Vaping Use: Never used  Substance and Sexual Activity   Alcohol use: No   Drug use: No   Sexual activity: Not on file  Other Topics Concern   Not on file  Social History Narrative   ** Merged History Encounter **       Social Determinants of Health   Financial Resource Strain: Not on file  Food Insecurity: Not on file  Transportation Needs: Not on file  Physical Activity: Not on file  Stress: Not on file  Social Connections: Not on file     Family History: The patient's family history is not on file.  ROS:   Please see the history of present illness.     All other systems reviewed and are negative.  EKGs/Labs/Other Studies Reviewed:    The following studies were reviewed today:          Recent Labs: 06/28/2022: ALT  18; BUN 15; Creatinine, Ser 0.87; Hemoglobin 13.0; Platelets 167; Potassium 3.8; Sodium 133  Recent Lipid Panel No results found for: "CHOL", "TRIG", "HDL", "CHOLHDL", "VLDL", "LDLCALC", "LDLDIRECT"   Risk Assessment/Calculations:              Physical Exam:    VS:  BP (!) 154/72   Pulse 74   Ht 5\' 3"  (1.6 m)   Wt 139 lb 12.8 oz (63.4 kg)   SpO2 97%   BMI 24.76 kg/m     Wt Readings from Last 3 Encounters:  07/08/22 139 lb 12.8 oz (63.4 kg)  06/28/22 142 lb (64.4 kg)  07/01/16 165 lb (74.8 kg)     GEN:  Well nourished, well developed in no acute distress HEENT: Normal NECK: No JVD; No carotid bruits LYMPHATICS: No lymphadenopathy CARDIAC: RRR, no murmurs, rubs, gallops RESPIRATORY:  Clear to auscultation without rales, wheezing or rhonchi  ABDOMEN: Soft, non-tender, non-distended MUSCULOSKELETAL:  No edema; No deformity  SKIN: Warm and dry NEUROLOGIC:  Alert and oriented x 3 PSYCHIATRIC:  Normal affect   ASSESSMENT:    1. Medication management   2. Primary hypertension    PLAN:    In order of problems listed above:  Primary hypertension - Currently on nifedipine 30 mg twice a day and clonidine 0.1 mg twice a day.  She may take an additional clonidine if above 160. -I will start spironolactone 25 mg once a day.  Recheck basic metabolic profile in 2 weeks. -Refer to hypertension clinic -Encouraged daily exercise, walking. -Explained that dizziness would usually only occur if blood pressure were too low which is not the case.  Hence the dizziness could be from a another mechanism. - Unsure of hoarseness correlation with finding.  Could be GERD.           Medication Adjustments/Labs and Tests Ordered: Current medicines are reviewed at length with the patient today.  Concerns regarding medicines are outlined above.  Orders Placed This Encounter  Procedures   Basic metabolic panel   AMB Referral to [facility] Pharm-D   Meds ordered this encounter   Medications   spironolactone (ALDACTONE) 25 MG tablet    Sig: Take 1 tablet (25 mg total) by mouth daily.    Dispense:  90 tablet    Refill:  3    Patient Instructions  Medication Instructions:  Please start Spironolactone 25 mg once a day. Continue all other medications as listed.  *If you need a refill on your cardiac medications before your next appointment, please call your pharmacy*   Lab Work: Please have blood  work in 2 weeks (BMP)  If you have labs (blood work) drawn today and your tests are completely normal, you will receive your results only by: MyChart Message (if you have MyChart) OR A paper copy in the mail If you have any lab test that is abnormal or we need to change your treatment, we will call you to review the results.  You have been referred to our Hypertension Clinic ran by our pharmacist here at the [facility] office.  Follow-Up: At [facility], you and your health needs are our priority.  As part of our continuing mission to provide you with exceptional heart care, we have created designated Provider Care Teams.  These Care Teams include your primary Cardiologist (physician) and Advanced Practice Providers (APPs -  Physician Assistants and Nurse Practitioners) who all work together to provide you with the care you need, when you need it.  We recommend signing up for the patient portal called "MyChart".  Sign up information is provided on this After Visit Summary.  MyChart is used to connect with patients for Virtual Visits (Telemedicine).  Patients are able to view lab/test results, encounter notes, upcoming appointments, etc.  Non-urgent messages can be sent to your provider as well.   To learn more about what you can do with MyChart, go to [email or web address].    Your next appointment:   1 year(s)  Provider:   Donato Schultz, MD        Signed, Donato Schultz, MD  07/08/2022 3:31 PM    [facility]

## 2022-07-08 NOTE — Patient Instructions (Signed)
Medication Instructions:  Please start Spironolactone 25 mg once a day. Continue all other medications as listed.  *If you need a refill on your cardiac medications before your next appointment, please call your pharmacy*   Lab Work: Please have blood work in 2 weeks (BMP)  If you have labs (blood work) drawn today and your tests are completely normal, you will receive your results only by: MyChart Message (if you have MyChart) OR A paper copy in the mail If you have any lab test that is abnormal or we need to change your treatment, we will call you to review the results.  You have been referred to our Hypertension Clinic ran by our pharmacist here at the [facility] office.  Follow-Up: At [facility], you and your health needs are our priority.  As part of our continuing mission to provide you with exceptional heart care, we have created designated Provider Care Teams.  These Care Teams include your primary Cardiologist (physician) and Advanced Practice Providers (APPs -  Physician Assistants and Nurse Practitioners) who all work together to provide you with the care you need, when you need it.  We recommend signing up for the patient portal called "MyChart".  Sign up information is provided on this After Visit Summary.  MyChart is used to connect with patients for Virtual Visits (Telemedicine).  Patients are able to view lab/test results, encounter notes, upcoming appointments, etc.  Non-urgent messages can be sent to your provider as well.   To learn more about what you can do with MyChart, go to [email or web address].    Your next appointment:   1 year(s)  Provider:   Donato Schultz, MD

## 2022-07-09 DIAGNOSIS — F419 Anxiety disorder, unspecified: Secondary | ICD-10-CM | POA: Diagnosis not present

## 2022-07-09 DIAGNOSIS — Z0000 Encounter for general adult medical examination without abnormal findings: Secondary | ICD-10-CM | POA: Diagnosis not present

## 2022-07-09 DIAGNOSIS — F325 Major depressive disorder, single episode, in full remission: Secondary | ICD-10-CM | POA: Diagnosis not present

## 2022-07-09 DIAGNOSIS — I10 Essential (primary) hypertension: Secondary | ICD-10-CM | POA: Diagnosis not present

## 2022-07-09 DIAGNOSIS — E559 Vitamin D deficiency, unspecified: Secondary | ICD-10-CM | POA: Diagnosis not present

## 2022-07-09 DIAGNOSIS — R5383 Other fatigue: Secondary | ICD-10-CM | POA: Diagnosis not present

## 2022-07-09 DIAGNOSIS — K219 Gastro-esophageal reflux disease without esophagitis: Secondary | ICD-10-CM | POA: Diagnosis not present

## 2022-07-09 DIAGNOSIS — Z79899 Other long term (current) drug therapy: Secondary | ICD-10-CM | POA: Diagnosis not present

## 2022-07-09 DIAGNOSIS — H409 Unspecified glaucoma: Secondary | ICD-10-CM | POA: Diagnosis not present

## 2022-07-11 DIAGNOSIS — I10 Essential (primary) hypertension: Secondary | ICD-10-CM | POA: Diagnosis not present

## 2022-07-22 ENCOUNTER — Ambulatory Visit: Payer: Medicare PPO

## 2022-07-30 NOTE — Progress Notes (Unsigned)
Patient ID: Kristy Hebert                 DOB: December 31, 1940                      MRN: [id number]     HPI: Kristy Hebert is a 82 y.o. female referred by Dr. Anne Fu to HTN clinic. PMH is significant for HTN, HLD, GERD, anxiety and depression. Pt established with Dr Anne Fu on 07/08/22 where BP was elevated at 154/72, had been referred by PCP for HTN management. Reported multiple intolerances to BP meds. She was started on spironolactone 25mg  daily and referred to PharmD.  Patient presents today for follow up with her husband. She previously followed with Dr Kevan Ny. Reports BP was well controlled on atenolol and amlodipine and she felt well until a visit with Dr Orson Aloe (new PCP after Dr Kevan Ny retired). Reports HR was in the 40s at that time and her atenolol was discontinued. Also noted blurry vision on amlodipine. Since March-April, she has had many BP changes made in quick succession which have made it challenging to determine efficacy of meds. She reports feeling very poorly overall during this time. Feels weak when her SBP increases to 150-160 range and has to lay down during these times. Has been afraid to go out in case her blood pressure increases.  Notes she did not start spironolactone as she previously tried this a few months ago and didn't tolerate secondary to weakness. She is also not tolerating her current nifedipine (has a rash on her arms, reports it flares up an hour after taking her nifedipine, also weakness) or clonidine (raspy voice, fatigue, salivating). Takes 2 telmisartan 40mg  tabs at night as she reports tolerating this well but experienced heart racing when she took 1 of the 80mg  tabs previously. Spaces apart her nifedipine and clonidine so she is taking a BP pill 4x a day.  Intolerances noted below and allergy list updated: Atenolol - bradycardia in the 40s Amlodipine - blurry vision Carvedilol - bradycardia Clonidine - weakness, dizziness, raspy voice,  salivating Metoprolol - diarrhea Nifedipine - made rash flare up, weakness Hydralazine - dizziness Losartan-hydrochlorothiazide - bruising, fatigue, tremor Spironolactone 25mg  daily - weakness Telmisartan 80mg  - made heart race. Tolerates 2 of the 40mg  tabs.  Current HTN meds:  Clonidine 0.1mg  BID - 12pm and 9pm, with extra tab if SBP > 160 Nifedipine ER 30mg  BID - 7am + 4pm Telmisartan 80mg  daily (takes 2 of the 40s)  BP goal: <130/33mmHg  Family History: Not on file.  Social History: No tobacco, alcohol or drug use.  Diet: 1/2 cup of coffee a day, doesn't add salt to food, likes vegetables, chicken, and fish.  Exercise: staying active at home   Home BP readings: Very detailed log brought today. Many readings in the 120s-130s, usually earlier in the day, and many readings in the 140-160 range as well.  Wt Readings from Last 3 Encounters:  07/08/22 139 lb 12.8 oz (63.4 kg)  06/28/22 142 lb (64.4 kg)  07/01/16 165 lb (74.8 kg)   BP Readings from Last 3 Encounters:  07/08/22 (!) 154/72  06/28/22 (!) 137/58  07/01/16 (!) 143/66   Pulse Readings from Last 3 Encounters:  07/08/22 74  06/28/22 74  07/01/16 (!) 55    Renal function: CrCl cannot be calculated (Patient's most recent lab result is older than the maximum 21 days allowed.).  Past Medical History:  Diagnosis Date   Anxiety  Complication of anesthesia    Depression    GERD (gastroesophageal reflux disease)    Glaucoma    Hyperglycemia    Hyperlipidemia    Hypertension    Insomnia    Obesity    PONV (postoperative nausea and vomiting)    with ankle surgery   Vitamin D deficiency     Current Outpatient Medications on File Prior to Visit  Medication Sig Dispense Refill   ALPRAZolam (XANAX) 1 MG tablet Take 1.5 mg by mouth at bedtime as needed for anxiety.      ALPRAZolam (XANAX) 1 MG tablet Take 1.5 mg by mouth at bedtime.     alprazolam (XANAX) 2 MG tablet Take 2 mg by mouth 3 (three) times daily.      cloNIDine (CATAPRES) 0.1 MG tablet Take 0.1 mg by mouth 2 (two) times daily.     dorzolamide-timolol (COSOPT) 22.3-6.8 MG/ML ophthalmic solution Place 1 drop into both eyes 2 (two) times daily.     NIFEdipine (ADALAT CC) 30 MG 24 hr tablet Take 30 mg by mouth 2 (two) times daily.     spironolactone (ALDACTONE) 25 MG tablet Take 1 tablet (25 mg total) by mouth daily. 90 tablet 3   telmisartan (MICARDIS) 40 MG tablet Take 80 mg by mouth at bedtime. Pt take 2 tablets at bedtime.     TRAVATAN Z 0.004 % SOLN ophthalmic solution Place 1 drop into both eyes at bedtime.     Travoprost, BAK Free, (TRAVATAN) 0.004 % SOLN ophthalmic solution Place 1 drop into both eyes at bedtime.     No current facility-administered medications on file prior to visit.    No Known Allergies   Assessment/Plan:  1. Hypertension - BP elevated above goal < 130/15mmHg today in clinic and with ~50% of home readings. BP management complicated by intolerance to a multitude of medications. Currently taking telmisartan 80mg  at night (2 of the 40mg  tablets as she reports not tolerating the 80mg  tablet), clonidine 0.1mg  BID (not tolerating secondary to fatigue, dizziness, and hoarse voice), and felodipine (reports weakness and making rash on her arm flare up). She is previously intolerant to beta blockers (bradycardia), amlodipine (blurry vision), and spironolactone (weakness).  -Will stop felodipine secondary to side effects and replace with nifedipine 10mg  at bedtime -Will decrease clonidine from 0.1mg  BID to 0.1mg  daily secondary to side effects, with plan to discontinue at next appt. Discussed cannot stop this abruptly as it can cause rebound HTN -Will continue telmisartan 80mg  as 2 40mg  tabs at bedtime which pt reports tolerating -Will send in rx for hydralazine 25mg  to use prn for SBP > 160 as pt prefers to have med on hand for elevated BP that make her feel poorly, and with ultimate goal of clonidine d/c do not want her  taking extra clonidine tabs prn -Avoiding beta blockers with history of bradycardia -Avoiding thiazides and MRA due to hyponatremia on recent BMET  Pt avoids NSAIDs, caffeine, and doesn't add salt to food. Stays active with physical activity. Reports home bicep BP cuff previously calibrated at PCP office and measuring correctly. Establishes with new PCP Dr Ruthine Dose in a month, she uses Epic which will make collaboration for BP management easier between our offices.  F/u with me in 3 weeks. Advised pt it can take a few weeks to see full effects of med changes.  1 hour spent with patient today.  Jakyrie Totherow E. Lyonel Morejon, PharmD, BCACP, CPP [facility] [address], [place], [place] [address] Phone: [phone])  [phone]; Fax: [phone] 07/31/2022 2:19 PM

## 2022-07-31 ENCOUNTER — Ambulatory Visit: Payer: Medicare PPO | Attending: Cardiology | Admitting: Pharmacist

## 2022-07-31 VITALS — BP 158/70 | HR 62 | Wt 143.0 lb

## 2022-07-31 DIAGNOSIS — F419 Anxiety disorder, unspecified: Secondary | ICD-10-CM | POA: Insufficient documentation

## 2022-07-31 DIAGNOSIS — F325 Major depressive disorder, single episode, in full remission: Secondary | ICD-10-CM | POA: Insufficient documentation

## 2022-07-31 DIAGNOSIS — R739 Hyperglycemia, unspecified: Secondary | ICD-10-CM | POA: Insufficient documentation

## 2022-07-31 DIAGNOSIS — I10 Essential (primary) hypertension: Secondary | ICD-10-CM | POA: Insufficient documentation

## 2022-07-31 DIAGNOSIS — H409 Unspecified glaucoma: Secondary | ICD-10-CM | POA: Insufficient documentation

## 2022-07-31 DIAGNOSIS — G4700 Insomnia, unspecified: Secondary | ICD-10-CM | POA: Insufficient documentation

## 2022-07-31 DIAGNOSIS — K219 Gastro-esophageal reflux disease without esophagitis: Secondary | ICD-10-CM | POA: Insufficient documentation

## 2022-07-31 DIAGNOSIS — E785 Hyperlipidemia, unspecified: Secondary | ICD-10-CM | POA: Insufficient documentation

## 2022-07-31 MED ORDER — HYDRALAZINE HCL 25 MG PO TABS: ORAL_TABLET | ORAL | 3 refills | Status: DC

## 2022-07-31 MED ORDER — FELODIPINE ER 10 MG PO TB24: 10.0000 mg | ORAL_TABLET | Freq: Every day | ORAL | 0 refills | Status: DC

## 2022-07-31 NOTE — Patient Instructions (Addendum)
Your blood pressure goal is < 130/78mmHg   -Stop your nifedipine -Start felodipine 10mg  - 1 pill daily at bedtime -Continue taking your telmisartan at bedtime -Decrease your clonidine to 1 pill once daily, during the day -If you're feeling poorly and your systolic blood pressure is over 160, you can take a dose of hydralazine  Call clinic with any concerns before your next appointment in 3 weeks, [phone]  Important lifestyle changes to control high blood pressure  Intervention  Effect on the BP   Weight loss Weight loss is one of the most effective lifestyle changes for controlling blood pressure. If you're overweight or obese, losing even a small amount of weight can help reduce blood pressure.    Blood pressure can decrease by 1 millimeter of mercury (mmHg) with each kilogram (about 2.2 pounds) of weight lost.   Exercise regularly As a general goal, aim for 30 minutes of moderate physical activity every day.    Regular physical activity can lower blood pressure by 5 - 8 mmHg.   Eat a healthy diet Eat a diet rich in whole grains, fruits, vegetables, lean meat, and low-fat dairy products. Limit processed foods, saturated fat, and sweets.    A heart-healthy diet can lower high blood pressure by 10 mmHg.   Reduce salt (sodium) in your diet Aim for 000mg  of sodium each day. Avoid deli meats, canned food, and frozen microwave meals which are high in sodium.     Limiting sodium can reduce blood pressure by 5 mmHg.   Limit alcohol One drink equals 12 ounces of beer, 5 ounces of wine, or 1.5 ounces of 80-proof liquor.    Limiting alcohol to < 1 drink a day for women or < 2 drinks a day for men can help lower blood pressure by about 4 mmHg.   To check your pressure at home you will need to:   Sit up in a chair, with feet flat on the floor and back supported. Do not cross your ankles or legs. Rest your left arm so that the cuff is about heart level. If the cuff goes on  your upper arm, then just relax your arm on the table, arm of the chair, or your lap. If you have a wrist cuff, hold your wrist against your chest at heart level. Place the cuff snugly around your arm, about 1 inch above the crease of your elbow. The cords should be inside the groove of your elbow.  Sit quietly, with the cuff in place, for about 5 minutes. Then press the power button to start a reading. Do not talk or move while the reading is taking place.  Record your readings on a sheet of paper. Although most cuffs have a memory, it is often easier to see a pattern developing when the numbers are all in front of you.  You can repeat the reading after 1-3 minutes if it is recommended.   Make sure your bladder is empty and you have not had caffeine or tobacco within the last 30 minutes   Always bring your blood pressure log with you to your appointments. If you have not brought your monitor in to be double checked for accuracy, please bring it to your next appointment.   You can find a list of validated (accurate) blood pressure cuffs at: validatebp.org

## 2022-08-07 ENCOUNTER — Telehealth: Payer: Self-pay | Admitting: Cardiology

## 2022-08-07 MED ORDER — HYDRALAZINE HCL 25 MG PO TABS: ORAL_TABLET | ORAL | Status: DC

## 2022-08-07 NOTE — Telephone Encounter (Signed)
Pt c/o medication issue:  1. Name of Medication: hydrALAZINE (APRESOLINE) 25 MG tablet   2. How are you currently taking this medication (dosage and times per day)? As prescribed   3. Are you having a reaction (difficulty breathing--STAT)?   4. What is your medication issue? Patient's BP has gone up to 172/82 and states took she took this medication and laid down and when she woke it systolic was 182. She would like a call back to see how many times she is able to take this medication a day. Please advise.

## 2022-08-07 NOTE — Telephone Encounter (Signed)
Pt made aware. She appreciates the follow up

## 2022-08-07 NOTE — Telephone Encounter (Signed)
Pt states she has been having BP issues for about the  last 3-4 months. Today 9:40 am, 172/82 - she took a Hydralazine. @11 :00 am 189/84 Clonidine at noon (she is being weaned off of this) @3 :45 pm 187/89 - she took another Hydralazine.  Pt calling in wanting advisement and how often/when she should take Hydralazine. Advised that she should take no more that one Hydralazine daily PRN until provider advises otherwise. Aware forwarding to pharmD to follow up on this  matter.  Pt agreeable to plan.

## 2022-08-07 NOTE — Telephone Encounter (Signed)
She may take 3-4 times a day

## 2022-08-20 NOTE — Progress Notes (Unsigned)
Patient ID: Kristy Hebert                 DOB: 14-Apr-1940                      MRN: [id number]     HPI: Kristy Hebert is a 82 y.o. female referred by Dr. Anne Fu to HTN clinic. PMH is significant for HTN, HLD, GERD, anxiety and depression. Pt established with Dr Anne Fu on 07/08/22 where BP was elevated at 154/72, had been referred by PCP for HTN management. Reported multiple intolerances to BP meds. She was started on spironolactone 25mg  daily and referred to PharmD.  Patient presents today for follow up with her husband. She previously followed with Dr Kevan Ny. Reports BP was well controlled on atenolol and amlodipine and she felt well until a visit with Dr Orson Aloe (new PCP after Dr Kevan Ny retired). Reports HR was in the 40s at that time and her atenolol was discontinued. Also noted blurry vision on amlodipine. Since March-April, she has had many BP changes made in quick succession which have made it challenging to determine efficacy of meds. She reports feeling very poorly overall during this time. Feels weak when her SBP increases to 150-160 range and has to lay down during these times. Has been afraid to go out in case her blood pressure increases.  Notes she did not start spironolactone as she previously tried this a few months ago and didn't tolerate secondary to weakness. She is also not tolerating her current nifedipine (has a rash on her arms, reports it flares up an hour after taking her nifedipine, also weakness) or clonidine (raspy voice, fatigue, salivating). Takes 2 telmisartan 40mg  tabs at night as she reports tolerating this well but experienced heart racing when she took 1 of the 80mg  tabs previously. Spaces apart her nifedipine and clonidine so she is taking a BP pill 4x a day.  Intolerances noted below and allergy list updated: Atenolol - bradycardia in the 40s Amlodipine - blurry vision Carvedilol - bradycardia Clonidine - weakness, dizziness, raspy voice,  salivating Metoprolol - diarrhea Nifedipine - made rash flare up, weakness Hydralazine - dizziness Losartan-hydrochlorothiazide - bruising, fatigue, tremor Spironolactone 25mg  daily - weakness Telmisartan 80mg  - made heart race. Tolerates 2 of the 40mg  tabs.  Current HTN meds:  Clonidine 0.1mg  BID - 12pm and 9pm, with extra tab if SBP > 160 Nifedipine ER 30mg  BID - 7am + 4pm Telmisartan 80mg  daily (takes 2 of the 40s)  BP goal: <130/30mmHg  Family History: Not on file.  Social History: No tobacco, alcohol or drug use.  Diet: 1/2 cup of coffee a day, doesn't add salt to food, likes vegetables, chicken, and fish.  Exercise: staying active at home   Home BP readings: Very detailed log brought today. Many readings in the 120s-130s, usually earlier in the day, and many readings in the 140-160 range as well.  Wt Readings from Last 3 Encounters:  07/31/22 143 lb (64.9 kg)  07/08/22 139 lb 12.8 oz (63.4 kg)  06/28/22 142 lb (64.4 kg)   BP Readings from Last 3 Encounters:  07/31/22 (!) 158/70  07/08/22 (!) 154/72  06/28/22 (!) 137/58   Pulse Readings from Last 3 Encounters:  07/31/22 62  07/08/22 74  06/28/22 74    Renal function: CrCl cannot be calculated (Patient's most recent lab result is older than the maximum 21 days allowed.).  Past Medical History:  Diagnosis Date   Anxiety  Complication of anesthesia    Depression    GERD (gastroesophageal reflux disease)    Glaucoma    Hyperglycemia    Hyperlipidemia    Hypertension    Insomnia    Obesity    PONV (postoperative nausea and vomiting)    with ankle surgery   Vitamin D deficiency     Current Outpatient Medications on File Prior to Visit  Medication Sig Dispense Refill   ALPRAZolam (XANAX XR) 2 MG 24 hr tablet Take 3.75 mg by mouth every morning. Takes 1 and 4/5 tablets daily     cloNIDine (CATAPRES) 0.1 MG tablet Take 0.1 mg by mouth daily.     dorzolamide-timolol (COSOPT) 22.3-6.8 MG/ML ophthalmic  solution Place 1 drop into both eyes 2 (two) times daily.     felodipine (PLENDIL) 10 MG 24 hr tablet Take 1 tablet (10 mg total) by mouth daily. 30 tablet 0   hydrALAZINE (APRESOLINE) 25 MG tablet Take 1 tablet every 6-8 hours, as needed for systolic blood pressure over 160     telmisartan (MICARDIS) 40 MG tablet Take 80 mg by mouth at bedtime. Pt take 2 tablets at bedtime.     TRAVATAN Z 0.004 % SOLN ophthalmic solution Place 1 drop into both eyes at bedtime.     Travoprost, BAK Free, (TRAVATAN) 0.004 % SOLN ophthalmic solution Place 1 drop into both eyes at bedtime.     No current facility-administered medications on file prior to visit.    Allergies  Allergen Reactions   Amlodipine     Blurry vision   Atenolol     Bradycardia    Carvedilol     Bradycardia   Clonidine Derivatives     Weakness, dizziness, raspy voice, salivating   Felodipine     Made rash worse, weakness   Hydralazine     dizziness   Losartan Potassium-Hctz     Fatigue, tremor   Spironolactone     Weakness on 25mg  daily   Telmisartan     Heart raced on 80mg  tab. Tolerates 2 of the 40mg  tabs.     Assessment/Plan:  1. Hypertension - BP elevated above goal < 130/18mmHg today in clinic and with ~50% of home readings. BP management complicated by intolerance to a multitude of medications. Currently taking telmisartan 80mg  at night (2 of the 40mg  tablets as she reports not tolerating the 80mg  tablet), clonidine 0.1mg  BID (not tolerating secondary to fatigue, dizziness, and hoarse voice), and felodipine (reports weakness and making rash on her arm flare up). She is previously intolerant to beta blockers (bradycardia), amlodipine (blurry vision), and spironolactone (weakness).  -Will stop felodipine secondary to side effects and replace with nifedipine 10mg  at bedtime -Will decrease clonidine from 0.1mg  BID to 0.1mg  daily secondary to side effects, with plan to discontinue at next appt. Discussed cannot stop this  abruptly as it can cause rebound HTN -Will continue telmisartan 80mg  as 2 40mg  tabs at bedtime which pt reports tolerating -Will send in rx for hydralazine 25mg  to use prn for SBP > 160 as pt prefers to have med on hand for elevated BP that make her feel poorly, and with ultimate goal of clonidine d/c do not want her taking extra clonidine tabs prn -Avoiding beta blockers with history of bradycardia -Avoiding thiazides and MRA due to hyponatremia on recent BMET  Pt avoids NSAIDs, caffeine, and doesn't add salt to food. Stays active with physical activity. Reports home bicep BP cuff previously calibrated at PCP office and measuring correctly.  Establishes with new PCP Dr Ruthine Dose in a month, she uses Epic which will make collaboration for BP management easier between our offices.  F/u with me in 3 weeks. Advised pt it can take a few weeks to see full effects of med changes.  1 hour spent with patient today.  Megan E. Supple, PharmD, BCACP, CPP [facility] [address] [place], [place] [address] Phone: [phone]; Fax: [phone] 08/20/2022 4:53 PM

## 2022-08-21 ENCOUNTER — Ambulatory Visit: Payer: Medicare PPO | Attending: Cardiology | Admitting: Pharmacist

## 2022-08-21 VITALS — BP 172/80 | HR 60

## 2022-08-21 DIAGNOSIS — I10 Essential (primary) hypertension: Secondary | ICD-10-CM | POA: Diagnosis not present

## 2022-08-21 MED ORDER — HYDRALAZINE HCL 25 MG PO TABS: 25.0000 mg | ORAL_TABLET | Freq: Two times a day (BID) | ORAL | 3 refills | Status: DC

## 2022-08-21 MED ORDER — FELODIPINE ER 10 MG PO TB24: 10.0000 mg | ORAL_TABLET | Freq: Every day | ORAL | 3 refills | Status: DC

## 2022-08-21 NOTE — Patient Instructions (Addendum)
Your blood pressure goal is < 130/51mmHg   -Start taking hydralazine 25mg  twice daily. You can still take an extra tablet as needed if your systolic blood pressure is over 160  -Continue taking:  -Felodipine 10mg  - take 1/2 tablet tonight, 1/2 tablet tomorrow morning, then start with 1 tablet daily in the morning the next day -Telmisartan 40mg  - continue taking 2 tablets at bedtime  Important lifestyle changes to control high blood pressure  Intervention  Effect on the BP   Weight loss Weight loss is one of the most effective lifestyle changes for controlling blood pressure. If you're overweight or obese, losing even a small amount of weight can help reduce blood pressure.    Blood pressure can decrease by 1 millimeter of mercury (mmHg) with each kilogram (about 2.2 pounds) of weight lost.   Exercise regularly As a general goal, aim for 30 minutes of moderate physical activity every day.    Regular physical activity can lower blood pressure by 5 - 8 mmHg.   Eat a healthy diet Eat a diet rich in whole grains, fruits, vegetables, lean meat, and low-fat dairy products. Limit processed foods, saturated fat, and sweets.    A heart-healthy diet can lower high blood pressure by 10 mmHg.   Reduce salt (sodium) in your diet Aim for 000mg  of sodium each day. Avoid deli meats, canned food, and frozen microwave meals which are high in sodium.     Limiting sodium can reduce blood pressure by 5 mmHg.   Limit alcohol One drink equals 12 ounces of beer, 5 ounces of wine, or 1.5 ounces of 80-proof liquor.    Limiting alcohol to < 1 drink a day for women or < 2 drinks a day for men can help lower blood pressure by about 4 mmHg.   To check your pressure at home you will need to:   Sit up in a chair, with feet flat on the floor and back supported. Do not cross your ankles or legs. Rest your left arm so that the cuff is about heart level. If the cuff goes on your upper arm, then just relax  your arm on the table, arm of the chair, or your lap. If you have a wrist cuff, hold your wrist against your chest at heart level. Place the cuff snugly around your arm, about 1 inch above the crease of your elbow. The cords should be inside the groove of your elbow.  Sit quietly, with the cuff in place, for about 5 minutes. Then press the power button to start a reading. Do not talk or move while the reading is taking place.  Record your readings on a sheet of paper. Although most cuffs have a memory, it is often easier to see a pattern developing when the numbers are all in front of you.  You can repeat the reading after 1-3 minutes if it is recommended.   Make sure your bladder is empty and you have not had caffeine or tobacco within the last 30 minutes   Always bring your blood pressure log with you to your appointments. If you have not brought your monitor in to be double checked for accuracy, please bring it to your next appointment.   You can find a list of validated (accurate) blood pressure cuffs at: validatebp.org

## 2022-08-22 ENCOUNTER — Encounter: Payer: Self-pay | Admitting: Pharmacist

## 2022-08-27 ENCOUNTER — Encounter: Payer: Self-pay | Admitting: Family Medicine

## 2022-08-27 ENCOUNTER — Ambulatory Visit: Payer: Medicare PPO | Admitting: Family Medicine

## 2022-08-27 VITALS — BP 138/70 | HR 62 | Temp 98.3°F | Ht 63.0 in | Wt 144.2 lb

## 2022-08-27 DIAGNOSIS — F5101 Primary insomnia: Secondary | ICD-10-CM | POA: Diagnosis not present

## 2022-08-27 DIAGNOSIS — F411 Generalized anxiety disorder: Secondary | ICD-10-CM

## 2022-08-27 DIAGNOSIS — I10 Essential (primary) hypertension: Secondary | ICD-10-CM | POA: Diagnosis not present

## 2022-08-27 MED ORDER — ALPRAZOLAM 2 MG PO TABS
3.0000 mg | ORAL_TABLET | Freq: Every evening | ORAL | 2 refills | Status: DC | PRN
  Filled 2022-09-23 – 2022-09-24 (×2): qty 45, 30d supply, fill #0
  Filled 2022-10-18 – 2022-10-22 (×2): qty 45, 30d supply, fill #1

## 2022-08-27 MED ORDER — MUPIROCIN 2 % EX OINT: 1.0000 | TOPICAL_OINTMENT | Freq: Two times a day (BID) | CUTANEOUS | 0 refills | Status: DC

## 2022-08-27 NOTE — Assessment & Plan Note (Signed)
Chronic.  She has multiple medication sensitivities.  She is very hesitant to start any new medications.  She has been taking alprazolam for over 50 years.  She is willing to try to cut down on daytime use.  However, states that she needs this medication to help her sleep.  States "you know how important sleep is".  Has tried other medications in the past as well.

## 2022-08-27 NOTE — Progress Notes (Signed)
New Patient Office Visit  Subjective:  Patient ID: Kristy Hebert, female    DOB: 29-Apr-1940  Age: 82 y.o. MRN: [id number]  CC:  Chief Complaint  Patient presents with   Establish Care    New pt establish care. She has been having elevated BP since May and has been trying to get it under control.    HPI Kristy Hebert presents for establishing care. Has 2 adoptive children. Widowed and remarried 6 years ago.   HTN - Pt is on Felodipine 10 mg nightly, Telmisartan 80mg  at night and Hydralazine 25 mg bid and third time prn.  Bp's running 130-150s/70-80s, was 140s-170s/70-80s while on clonidine. She states her elevated BP started in May, has since been trying to get it under control. She went to the ED on 6/14, was advised to go to the ED if her systolic BP went above 160. EKG was normal. Was instructed to stop Clonidine due to dizziness and weakness, feels much better since stopping. Followed up with Dr. Anne Fu from cardiology on 6/24 and with Margaretmary Dys, PharmD on 8/7. During her PharmD visit she reported intolerances to many antihypertensives. When her BP was uncontrolled she felt her legs would give out on her. Would lie in bed when her BP was previously elevated, which was a temporary fix for her. States at the time her HR would be in the 40s when on atenolol. Exercises regularly, heavy/fast walking, using recumbent bike for 45 minutes daily. No ha/dizziness/cp/palp/edema/cough/sob.  Cataracts - Vision is corrected to 20/20. Had to stop amlodipine due to blurry vision.   Insomnia/Anxiety - Has long hx of insomnia/anxiety. Is taking Alprazolam 3 mg nightly to help her rest as "rest/sleep is important". Will occasionally take during the day when anxious, as needed. No SI.   Spot on right ear - She complains of a spot behind her right ear-occurred after rubbing on pillow about 1 wk ago. She has been applying cortizone and covering with a bandage at nighttime.    Current Outpatient  Medications:    alprazolam (XANAX) 2 MG tablet, Take 1.5 tablets (3 mg total) by mouth at bedtime as needed for sleep., Disp: 45 tablet, Rfl: 2   dorzolamide-timolol (COSOPT) 22.3-6.8 MG/ML ophthalmic solution, Place 1 drop into both eyes 2 (two) times daily., Disp: , Rfl:    felodipine (PLENDIL) 10 MG 24 hr tablet, Take 1 tablet (10 mg total) by mouth daily., Disp: 90 tablet, Rfl: 3   hydrALAZINE (APRESOLINE) 25 MG tablet, Take 1 tablet by mouth twice daily. You can take an extra tablet as needed for systolic blood pressure over 160, Disp: 90 tablet, Rfl: 3   mupirocin ointment (BACTROBAN) 2 %, Place 1 Application into the nose 2 (two) times daily., Disp: 22 g, Rfl: 0   telmisartan (MICARDIS) 40 MG tablet, Take 80 mg by mouth at bedtime. Pt take 2 tablets at bedtime., Disp: , Rfl:    TRAVATAN Z 0.004 % SOLN ophthalmic solution, Place 1 drop into both eyes at bedtime., Disp: , Rfl:    Travoprost, BAK Free, (TRAVATAN) 0.004 % SOLN ophthalmic solution, Place 1 drop into both eyes at bedtime., Disp: , Rfl:   Past Medical History:  Diagnosis Date   Anxiety    Complication of anesthesia    Depression    GERD (gastroesophageal reflux disease)    Glaucoma    Hyperglycemia    Hyperlipidemia    Hypertension    Insomnia    Obesity    PONV (  postoperative nausea and vomiting)    with ankle surgery   Vitamin D deficiency     Past Surgical History:  Procedure Laterality Date   ANKLE SURGERY Left    CATARACT EXTRACTION, BILATERAL N/A 2021   CHOLECYSTECTOMY     COLONOSCOPY WITH PROPOFOL N/A 07/01/2016   Procedure: COLONOSCOPY WITH PROPOFOL;  Surgeon: Charolett Bumpers, MD;  Location: [facility];  Service: Endoscopy;  Laterality: N/A;    Family History  Problem Relation Age of Onset   Hypertension Mother     Social History   Socioeconomic History   Marital status: Married    Spouse name: Not on file   Number of children: 2   Years of education: Not on file   Highest education level:  Not on file  Occupational History   Not on file  Tobacco Use   Smoking status: Never   Smokeless tobacco: Never  Vaping Use   Vaping status: Never Used  Substance and Sexual Activity   Alcohol use: Never   Drug use: Never   Sexual activity: Not Currently    Partners: Male  Other Topics Concern   Not on file  Social History Narrative   Adopted children.  No grands   Retired Producer, television/film/video.   Social Determinants of Health   Financial Resource Strain: Not on file  Food Insecurity: Not on file  Transportation Needs: Not on file  Physical Activity: Not on file  Stress: Not on file  Social Connections: Not on file  Intimate Partner Violence: Not on file    ROS  ROS: Gen: no fever, chills  Skin: no rash, itching ENT: no ear pain, ear drainage, nasal congestion, rhinorrhea, sinus pressure, sore throat Eyes: no blurry vision, double vision Resp: no cough, wheeze,SOB CV: no CP, palpitations, LE edema,  GI: no heartburn, n/v/d/c, abd pain GU: no dysuria, urgency, frequency, hematuria MSK: no joint pain, myalgias, back pain Neuro: no dizziness, headache, weakness, vertigo Psych: no depression, anxiety, insomnia, SI   Objective:   Today's Vitals: BP 138/70   Pulse 62   Temp 98.3 F (36.8 C) (Temporal)   Ht 5\' 3"  (1.6 m)   Wt 144 lb 3.2 oz (65.4 kg)   SpO2 98%   BMI 25.54 kg/m   Physical Exam  Gen: [facility]DWN NAD HEENT: NCAT, conjunctiva not injected, sclera nonicteric NECK:  supple, no thyromegaly, no nodes, no carotid bruits CARDIAC: RRR, S1S2+, no murmur. DP 2+B LUNGS: CTAB. No wheezes ABDOMEN:  BS+, soft, NTND, No HSM, no masses EXT:  no edema MSK: no gross abnormalities.  NEURO: A&O x3.  CN II-XII intact.  PSYCH: normal mood. Good eye contact  +Scab on right ear/helux, a couple millimeters. Spot with no signs of infection.   Pulse 62  Reviewed notes/labs in chart.  PDMP checked. Spent total 60 min w/pt, reviwing chart.  Assessment & Plan:  Primary  hypertension Assessment & Plan: Chronic.  Controlled.  Continue full felodipine 10 mg daily, hydralazine 25 mg twice daily, telmisartan 40 mg (2 tabs at at bedtime).  She has multiple sensitivities to multiple medications.  This regimen is finally working.  Managed by hypertension clinic.  She has had a renal ultrasound done that was negative.  Urine for pheochromocytoma was negative in the past.  Continue this regimen and monitoring blood pressures.   Generalized anxiety disorder Assessment & Plan: Chronic.  She has multiple medication sensitivities.  She is very hesitant to start any new medications.  She has been taking alprazolam  for over 50 years.  She is willing to try to cut down on daytime use.  However, states that she needs this medication to help her sleep.  States "you know how important sleep is".  Has tried other medications in the past as well.   Primary insomnia Assessment & Plan: Chronic.  Patient has been taking Xanax for greater than 50 years.  She has tried some other medicines, but either did not work or had side effects.  We discussed the risks.  "I am fine".  Does not want to change medications.  She has been taking 3.5 mg at at bedtime.  She is willing to go down to 3 mg and possibly lower, but "I need to sleep".  PDMP was checked.  Prescription written for 3 mg at at bedtime.  Advised to try not to use any additional during the day.  Told her that this is high dose and I am not comfortable doing this high dose.   Other orders -     ALPRAZolam; Take 1.5 tablets (3 mg total) by mouth at bedtime as needed for sleep.  Dispense: 45 tablet; Refill: 2 -     Mupirocin; Place 1 Application into the nose 2 (two) times daily.  Dispense: 22 g; Refill: 0    Follow-up: Return in about 3 months (around 11/27/2022) for anxiety.    I,Rachel Rivera,acting as a scribe for Angelena Sole, MD.,have documented all relevant documentation on the behalf of Angelena Sole, MD,as directed by  Angelena Sole, MD while in the presence of Angelena Sole, MD.  I, Angelena Sole, MD, have reviewed all documentation for this visit. The documentation on 08/27/22 for the exam, diagnosis, procedures, and orders are all accurate and complete.   Angelena Sole, MD

## 2022-08-27 NOTE — Patient Instructions (Signed)
Welcome to [facility] at [facility]! It was a pleasure meeting you today.  As discussed, Please schedule a 3 month follow up visit today.  PLEASE NOTE:  If you had any LAB tests please let us know if you have not heard back within a few days. You may see your results on MyChart before we have a chance to review them but we will give you a call once they are reviewed by us. If we ordered any REFERRALS today, please let us know if you have not heard from their office within the next week.  Let us know through MyChart if you are needing REFILLS, or have your pharmacy send us the request. You can also use MyChart to communicate with me or any office staff.  Please try these tips to maintain a healthy lifestyle:  Eat most of your calories during the day when you are active. Eliminate processed foods including packaged sweets (pies, cakes, cookies), reduce intake of potatoes, white bread, white pasta, and white rice. Look for whole grain options, oat flour or almond flour.  Each meal should contain half fruits/vegetables, one quarter protein, and one quarter carbs (no bigger than a computer mouse).  Cut down on sweet beverages. This includes juice, soda, and sweet tea. Also watch fruit intake, though this is a healthier sweet option, it still contains natural sugar! Limit to 3 servings daily.  Drink at least 1 glass of water with each meal and aim for at least 8 glasses per day  Exercise at least 150 minutes every week.   

## 2022-08-27 NOTE — Assessment & Plan Note (Signed)
Chronic.  Patient has been taking Xanax for greater than 50 years.  She has tried some other medicines, but either did not work or had side effects.  We discussed the risks.  "I am fine".  Does not want to change medications.  She has been taking 3.5 mg at at bedtime.  She is willing to go down to 3 mg and possibly lower, but "I need to sleep".  PDMP was checked.  Prescription written for 3 mg at at bedtime.  Advised to try not to use any additional during the day.  Told her that this is high dose and I am not comfortable doing this high dose.

## 2022-08-27 NOTE — Assessment & Plan Note (Signed)
Chronic.  Controlled.  Continue full felodipine 10 mg daily, hydralazine 25 mg twice daily, telmisartan 40 mg (2 tabs at at bedtime).  Kristy Hebert has multiple sensitivities to multiple medications.  This regimen is finally working.  Managed by hypertension clinic.  Kristy Hebert has had a renal ultrasound done that was negative.  Urine for pheochromocytoma was negative in the past.  Continue this regimen and monitoring blood pressures.

## 2022-09-03 ENCOUNTER — Other Ambulatory Visit (HOSPITAL_BASED_OUTPATIENT_CLINIC_OR_DEPARTMENT_OTHER): Payer: Self-pay

## 2022-09-03 MED ORDER — HYDRALAZINE HCL 25 MG PO TABS: 25.0000 mg | ORAL_TABLET | ORAL | 1 refills | Status: DC | PRN

## 2022-09-03 MED ORDER — FLUTICASONE PROPIONATE 50 MCG/ACT NA SUSP: 1.0000 | Freq: Every day | NASAL | 11 refills | Status: AC

## 2022-09-03 MED ORDER — TELMISARTAN 40 MG PO TABS: 80.0000 mg | ORAL_TABLET | Freq: Every evening | ORAL | 3 refills | Status: DC

## 2022-09-03 MED ORDER — TRAVOPROST (BAK FREE) 0.004 % OP SOLN
1.0000 [drp] | Freq: Every evening | OPHTHALMIC | 3 refills | Status: AC
  Filled 2022-11-08: qty 10, 90d supply, fill #0
  Filled 2023-03-19 (×2): qty 10, 90d supply, fill #1
  Filled 2023-06-13 (×2): qty 10, 90d supply, fill #2

## 2022-09-03 MED ORDER — SPIRONOLACTONE 25 MG PO TABS: 25.0000 mg | ORAL_TABLET | Freq: Every day | ORAL | 3 refills | Status: DC

## 2022-09-03 MED ORDER — HYDROCHLOROTHIAZIDE 12.5 MG PO TABS: 12.5000 mg | ORAL_TABLET | Freq: Every morning | ORAL | 3 refills | Status: DC

## 2022-09-03 MED ORDER — CLONIDINE HCL 0.1 MG PO TABS: 0.1000 mg | ORAL_TABLET | Freq: Two times a day (BID) | ORAL | 0 refills | Status: DC

## 2022-09-03 MED ORDER — NIFEDIPINE ER 30 MG PO TB24: 30.0000 mg | ORAL_TABLET | Freq: Every morning | ORAL | 3 refills | Status: DC

## 2022-09-04 ENCOUNTER — Encounter: Payer: Self-pay | Admitting: Pharmacist

## 2022-09-10 NOTE — Progress Notes (Signed)
Patient ID: Kristy Hebert                 DOB: 03/08/1940                      MRN: [id number]     HPI: Kristy Hebert is a 82 y.o. female referred by Dr. Anne Hebert to HTN clinic. PMH is significant for HTN, HLD, GERD, anxiety and depression. Pt established with Dr Kristy Hebert on 07/08/22 where BP was elevated at 154/72, had been referred by PCP for HTN management. Reported multiple intolerances to BP meds. She was started on spironolactone 25mg  daily and referred to PharmD.   I saw pt on 7/17 where she reported feeling very poorly as there had been many BP medication changes made in a short period of time with her former PCP. BP previously well controlled on atenolol and amlodipine, but after PCP visit with HR in 40s atenolol was discontinued. After that, noted that amlodipine caused blurry vision and this was stopped. Since March-April, she had had many BP changes made in quick succession which had made it challenging to determine efficacy of meds. She reported feeling very poorly overall during this time. Felt weak when her SBP increased to 150-160 range and had to lay down during these times. Had been afraid to go out in case her blood pressure increases.  She had not started spironolactone as she had previously not tolerated due to weakness. She also noted medication intolerances to her current regimen including nifedipine (rash on arms, weakness) and clonidine (raspy voice, fatigue, salivating). Was tolerating telmisartan 40 mg - 2 tabs at night. At that visit clonidine was decreased to 0.1 mg once daily with plan to discontinue at next visit, nifedipine was switched to felodipine due to rash, and telmisartan was continued.   Last pharmacist visit 08/21/22. Brought in detailed home BP log, had been checking less frequently as instructed. Reported feeling much better since last visit, has been able to get back to her physical activity. Had been off clonidine for about a week and a half. Was tolerating  telmisartan well. Had been taking felodipine at night and noted increased nighttime urination since starting. She stated she was unable to fall back asleep after getting up and attributes increased BP to lack of sleep. Taking hydralazine PRN for when BP is high (about 4 doses in the past week). BP was improved on more recent home readings although still elevated. 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 Increased hydralazine to 25 mg BID scheduled with 1 extra tablet PRN SBP>160 given her many med intolerances. Pt reported via mychart on 8/8 she preferred to keep taking telmisartan at night. Reported via mychart on 8/21 BP has been looking much better and also looking to start new medication for her anxiety. PCP concerned about Xanax use and dementia. Had follow up visit with PCP on 8/13 and BP was 138/70. Has previously brought BP cuff in to PCP office and was reading accurately. BP is usually more elevated in office.   Today pt presents for HTN follow up. Brings in detailed home log of BP recorded below. Reports BP much improved since last visit although she has been feeling more dizzy and fatigued. Reports taking felodipine 10 mg in the morning, hydralazine 25 mg BID (1st Hebert ~11:30-12, 2nd Hebert 5-5:30 PM), telmisartan 80 mg at bedtime. Discussed break outs on her arms that appear improved since last visit. She attributes them  to her medications although has had these for a long time. Break outs improve with hydrocortisone cream. Has switched to [facility]. Established care with her new PCP recently. Reports Dr. Ruthine Hebert encouraged her to limit alprazolam use to before bedtime. Pt concerned about how she can control her anxiety during the day without alprazolam.  Current HTN meds:  Felodipine 10 mg daily (AM) Telmisartan 80mg  daily (takes 2 of the 40s in PM) Hydralazine 25 mg BID with 1 extra tab PRN SBP >160 (takes first Hebert around 11:30-12 and second Hebert around  5-5:30)  HTN med intolerances: Atenolol - bradycardia in the 40s Amlodipine - blurry vision Carvedilol - bradycardia Clonidine - weakness, dizziness, raspy voice, salivating Metoprolol - diarrhea Nifedipine - made rash flare up, weakness Hydralazine - dizziness Losartan-hydrochlorothiazide - bruising, fatigue, tremor Spironolactone 25mg  daily - weakness Telmisartan 80mg  - made heart race. Tolerates 2 of the 40mg  tabs.  BP goal: <130/81mmHg  Family History: Mother - HTN  Social History: No tobacco, alcohol or drug use.  Diet: 1/2 cup of coffee a day, doesn't add salt to food, likes vegetables, chicken, and fish.  Exercise: exercises 1 hour every day  Home BP readings: 8/9- 139/79, 144/71 8/10- 132/72, 143/72 8/11-130/74, 140/76 8/12- 130/71, 146/70 8/13- 128/67, 129/76 8/14- 122/69, 133/65 8/15- 132/73, 119/67, 158/76 (took an extra hydral), 147/77 8/16- 131/76, 135/76 8/17- 136/75, 134/72 8/18- 119/71, 138/71 8/19- 122/66, 121/64 8/20-128/39, 134/71 8/21-134/71, 131/67 8/22- 122/66, 122/68 8/23- 122/71, 132/78 8/24-132/75, 127/63 8/25- 122/73, 140/76 8/26- 120/69, 127/68 8/27- 120/68, 130/71 8/28- 145/75 (pt noted reading is elevated as she was rushing out the door to appt this morning and had an upset stomach)  Wt Readings from Last 3 Encounters:  08/27/22 144 lb 3.2 oz (65.4 kg)  07/31/22 143 lb (64.9 kg)  07/08/22 139 lb 12.8 oz (63.4 kg)   BP Readings from Last 3 Encounters:  09/11/22 (!) 140/72  08/27/22 138/70  08/21/22 (!) 172/80   Pulse Readings from Last 3 Encounters:  09/11/22 60  08/27/22 62  08/21/22 60    Renal function: CrCl cannot be calculated (Patient's most recent lab result is older than the maximum 21 days allowed.).  Past Medical History:  Diagnosis Date   Anxiety    Complication of anesthesia    Depression    GERD (gastroesophageal reflux disease)    Glaucoma    Hyperglycemia    Hyperlipidemia    Hypertension    Insomnia     Obesity    PONV (postoperative nausea and vomiting)    with ankle surgery   Vitamin D deficiency     Current Outpatient Medications on File Prior to Visit  Medication Sig Dispense Refill   alprazolam (XANAX) 2 MG tablet Take 1.5 tablets (3 mg total) by mouth at bedtime as needed for sleep. 45 tablet 2   dorzolamide-timolol (COSOPT) 22.3-6.8 MG/ML ophthalmic solution Place 1 drop into both eyes 2 (two) times daily.     fluticasone (FLONASE) 50 MCG/ACT nasal spray Place 1 spray into both nostrils daily. 16 g 11   mupirocin ointment (BACTROBAN) 2 % Place 1 Application into the nose 2 (two) times daily. 22 g 0   TRAVATAN Z 0.004 % SOLN ophthalmic solution Place 1 drop into both eyes at bedtime.     Travoprost, BAK Free, (TRAVATAN) 0.004 % SOLN ophthalmic solution Place 1 drop into both eyes every evening. 10 mL 3   No current facility-administered medications on file prior to visit.  Allergies  Allergen Reactions   Amlodipine     Blurry vision   Atenolol     Bradycardia    Carvedilol     Bradycardia   Clonidine Derivatives     Weakness, dizziness, raspy voice, salivating   Hydralazine     dizziness   Losartan Potassium-Hctz     Fatigue, tremor   Nifedipine     Made rash worse, weakness   Spironolactone     Weakness on 25mg  daily   Telmisartan     Heart raced on 80mg  tab. Tolerates 2 of the 40mg  tabs.     Assessment/Plan:  1. Hypertension - BP much improved and now majority of home readings are at goal < 130/34mmHg. 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 BP management complicated by intolerance to a multitude of medications. Currently taking telmisartan 80mg  at night (2 of the 40mg  tablets as she reports not tolerating the 80mg  tablet), felodipine 10 mg daily, and hydralazine 25 mg BID with 1 extra tab PRN if SBP>160. She is previously intolerant to beta blockers (bradycardia), amlodipine (blurry vision), and spironolactone  (weakness). Given increased lightheadedness and fatigue since increasing hydralazine to scheduled BID dosing and home BP appears well controlled, will reduce hydralazine 25 mg to 1/2 tablet to 1 tablet BID with an extra tab PRN SBP>160 to see if she tolerates this better. Encouraged her to discuss maintenance anxiety medications with her PCP at her next visit. Break outs on her arms appear improved since last visit and seem to be a chronic issue, encouraged her to reach out to PCP or dermatologist for evaluation if needed. Advised her to continue checking BP at home and let us know if she has any issues. Pt avoids NSAIDs, caffeine, and doesn't add salt to food. Stays physically active.   -Continue felodipine 10 mg daily in the morning -Continue telmisartan 80mg  as 2 40mg  tabs at bedtime (didn't tolerate 80mg  tablet) -Decrease hydralazine to 25 mg - 1/2 to 1 tablet twice daily with 1 extra tablet PRN SBP>160 -Avoiding beta blockers with history of bradycardia -Avoiding thiazides and MRA due to hyponatremia   F/u with PharmD as needed.  Adam Phenix, PharmD PGY-1 Pharmacy Resident  Megan E. Supple, PharmD, BCACP, CPP [facility] [address], [place], [place] [address] Phone: [phone]; Fax: [phone] 09/11/2022 12:25 PM

## 2022-09-11 ENCOUNTER — Other Ambulatory Visit (HOSPITAL_BASED_OUTPATIENT_CLINIC_OR_DEPARTMENT_OTHER): Payer: Self-pay

## 2022-09-11 ENCOUNTER — Ambulatory Visit: Payer: Medicare PPO | Attending: Internal Medicine | Admitting: Pharmacist

## 2022-09-11 ENCOUNTER — Encounter: Payer: Self-pay | Admitting: Pharmacist

## 2022-09-11 VITALS — BP 140/72 | HR 60

## 2022-09-11 DIAGNOSIS — I10 Essential (primary) hypertension: Secondary | ICD-10-CM

## 2022-09-11 MED ORDER — HYDRALAZINE HCL 10 MG PO TABS
10.0000 mg | ORAL_TABLET | Freq: Two times a day (BID) | ORAL | 3 refills | Status: AC
  Filled 2022-09-11: qty 150, 18d supply, fill #0
  Filled 2022-10-23 – 2022-11-12 (×2): qty 150, 18d supply, fill #1
  Filled 2023-02-08 – 2023-02-10 (×2): qty 150, 18d supply, fill #2
  Filled 2023-02-24: qty 150, 18d supply, fill #3

## 2022-09-11 MED ORDER — HYDRALAZINE HCL 25 MG PO TABS
12.5000 mg | ORAL_TABLET | Freq: Two times a day (BID) | ORAL | 3 refills | Status: DC
  Filled 2022-09-11: qty 90, 30d supply, fill #0

## 2022-09-11 MED ORDER — FELODIPINE ER 10 MG PO TB24
10.0000 mg | ORAL_TABLET | Freq: Every day | ORAL | 3 refills | Status: DC
  Filled 2022-09-11 – 2022-11-13 (×2): qty 90, 90d supply, fill #0
  Filled 2023-02-08 – 2023-02-10 (×2): qty 90, 90d supply, fill #1
  Filled 2023-04-28: qty 90, 90d supply, fill #2
  Filled 2023-07-11 – 2023-07-14 (×2): qty 90, 90d supply, fill #3

## 2022-09-11 MED ORDER — TELMISARTAN 40 MG PO TABS
80.0000 mg | ORAL_TABLET | Freq: Every evening | ORAL | 3 refills | Status: DC
  Filled 2022-09-11 – 2022-11-12 (×2): qty 180, 90d supply, fill #0
  Filled 2023-02-08 – 2023-02-10 (×2): qty 180, 90d supply, fill #1
  Filled 2023-04-28: qty 180, 90d supply, fill #2
  Filled 2023-07-14: qty 180, 90d supply, fill #3

## 2022-09-11 NOTE — Patient Instructions (Addendum)
You can try taking hydralazine 25 mg 1/2 tablet to 1 tablet twice daily with an extra tablet as needed for systolic blood pressure >160  Continue taking telmisartan and felodipine as you have been

## 2022-09-12 ENCOUNTER — Other Ambulatory Visit (HOSPITAL_BASED_OUTPATIENT_CLINIC_OR_DEPARTMENT_OTHER): Payer: Self-pay

## 2022-09-13 ENCOUNTER — Other Ambulatory Visit (HOSPITAL_BASED_OUTPATIENT_CLINIC_OR_DEPARTMENT_OTHER): Payer: Self-pay

## 2022-09-23 ENCOUNTER — Other Ambulatory Visit: Payer: Self-pay

## 2022-09-23 ENCOUNTER — Other Ambulatory Visit (HOSPITAL_BASED_OUTPATIENT_CLINIC_OR_DEPARTMENT_OTHER): Payer: Self-pay

## 2022-09-24 ENCOUNTER — Other Ambulatory Visit (HOSPITAL_BASED_OUTPATIENT_CLINIC_OR_DEPARTMENT_OTHER): Payer: Self-pay

## 2022-10-08 ENCOUNTER — Other Ambulatory Visit (HOSPITAL_BASED_OUTPATIENT_CLINIC_OR_DEPARTMENT_OTHER): Payer: Self-pay

## 2022-10-08 ENCOUNTER — Ambulatory Visit (INDEPENDENT_AMBULATORY_CARE_PROVIDER_SITE_OTHER): Payer: Medicare PPO | Admitting: Physician Assistant

## 2022-10-08 VITALS — BP 148/68 | HR 65 | Temp 98.2°F | Ht 63.0 in | Wt 144.6 lb

## 2022-10-08 DIAGNOSIS — E669 Obesity, unspecified: Secondary | ICD-10-CM | POA: Insufficient documentation

## 2022-10-08 DIAGNOSIS — E559 Vitamin D deficiency, unspecified: Secondary | ICD-10-CM | POA: Insufficient documentation

## 2022-10-08 DIAGNOSIS — B001 Herpesviral vesicular dermatitis: Secondary | ICD-10-CM | POA: Insufficient documentation

## 2022-10-08 DIAGNOSIS — F411 Generalized anxiety disorder: Secondary | ICD-10-CM | POA: Diagnosis not present

## 2022-10-08 DIAGNOSIS — F5101 Primary insomnia: Secondary | ICD-10-CM

## 2022-10-08 MED ORDER — ESCITALOPRAM OXALATE 5 MG PO TABS
5.0000 mg | ORAL_TABLET | Freq: Every morning | ORAL | 0 refills | Status: DC
Start: 2022-10-08 — End: 2022-10-23
  Filled 2022-10-08: qty 30, 30d supply, fill #0

## 2022-10-18 ENCOUNTER — Ambulatory Visit: Payer: Medicare PPO | Admitting: Family

## 2022-10-18 ENCOUNTER — Encounter: Payer: Self-pay | Admitting: Family Medicine

## 2022-10-18 ENCOUNTER — Other Ambulatory Visit (HOSPITAL_BASED_OUTPATIENT_CLINIC_OR_DEPARTMENT_OTHER): Payer: Self-pay

## 2022-10-18 ENCOUNTER — Other Ambulatory Visit: Payer: Self-pay

## 2022-10-18 VITALS — BP 158/77 | HR 64 | Temp 98.0°F | Ht 63.0 in | Wt 143.4 lb

## 2022-10-18 DIAGNOSIS — F411 Generalized anxiety disorder: Secondary | ICD-10-CM

## 2022-10-18 MED ORDER — DORZOLAMIDE HCL-TIMOLOL MAL 2-0.5 % OP SOLN
1.0000 [drp] | Freq: Two times a day (BID) | OPHTHALMIC | 2 refills | Status: DC
  Filled 2022-10-18: qty 20, 200d supply, fill #0
  Filled 2022-11-08: qty 20, 90d supply, fill #0

## 2022-10-18 MED ORDER — HYDROXYZINE HCL 10 MG PO TABS
10.0000 mg | ORAL_TABLET | Freq: Three times a day (TID) | ORAL | 0 refills | Status: DC | PRN
Start: 2022-10-18 — End: 2022-10-23
  Filled 2022-10-18: qty 30, 10d supply, fill #0

## 2022-10-18 NOTE — Telephone Encounter (Signed)
Please see patient message and advise if you would like her to go to the ED or come into the office

## 2022-10-18 NOTE — Progress Notes (Signed)
Patient ID: Kristy Hebert, female    DOB: 04/27/1940, 82 y.o.   MRN: [id number]  Chief Complaint  Patient presents with   Anxiety    Pt c/o Anxiety with blood pressure being high, weakness and tremors. Pt states she just started Lexapro and changed Xanax to bedtime and that's when sx started.    *Discussed the use of AI scribe software for clinical note transcription with the patient, who gave verbal consent to proceed.  History of Present Illness   The patient, with a history of hypertension, presents with concerns about elevated blood pressure readings, increased anxiety and tremors. She reports that her blood pressure has been consistently higher since starting escitalopram for anxiety almost 2w ago. Prior to starting escitalopram, her blood pressure readings were generally in the 120s to 130s systolic. However, after starting the medication, she noticed readings in the 160s and 170s. The patient also reports experiencing tremors and nervousness, which she believes may be withdrawal symptoms from discontinuing daytime use of alprazolam. She notes that these symptoms have not improved with escitalopram. The patient's husband corroborates her account, noting a change in her personality and energy levels.     Assessment & Plan:     Hypertension - Elevated blood pressure readings since starting escitalopram. Currently on telmisartan, felodipine, and hydralazine (as needed when systolic BP >160). -Discontinue escitalopram. -Start hydralazine 10mg  three times a day regularly, with the option to add an extra dose if needed, (RX reads 10-20mg  tid prn). -Check blood pressure regularly and follow up with primary care provider, Dr. Ruthine Dose as soon as able.  Anxiety - Patient experiencing tremors and nervousness, previously managed with alprazolam. Recently started on escitalopram to replace daytime alprazolam, but it has not been effective.  Suggested taking 1/2 of Alprazolam in am but pt states she  won't sleep at night without full dose. -Discontinue escitalopram. -Start hydroxyzine 10mg  tid prn to manage anxiety symptoms. -Cut Alprazolam take 1/2 in am and 1 full pill qhs, ok to take with 1 Hydroxyzine qhs. -Follow up with primary care provider, Dr. Ruthine Dose, for further management of anxiety and alprazolam dosing.      Subjective:    Outpatient Medications Prior to Visit  Medication Sig Dispense Refill   alprazolam (XANAX) 2 MG tablet Take 1.5 tablets (3 mg total) by mouth at bedtime as needed for sleep. 45 tablet 2   dorzolamide-timolol (COSOPT) 22.3-6.8 MG/ML ophthalmic solution Place 1 drop into both eyes 2 (two) times daily.     escitalopram (LEXAPRO) 5 MG tablet Take 1 tablet (5 mg total) by mouth in the morning with breakfast. 30 tablet 0   felodipine (PLENDIL) 10 MG 24 hr tablet Take 1 tablet (10 mg total) by mouth daily. 90 tablet 3   fluticasone (FLONASE) 50 MCG/ACT nasal spray Place 1 spray into both nostrils daily. 16 g 11   hydrALAZINE (APRESOLINE) 10 MG tablet Take 1-2 tablets (10-20 mg total) by mouth 2 (two) times daily. Can take an extra 1-2 tablets as needed for systolic BP > 160 150 tablet 3   telmisartan (MICARDIS) 40 MG tablet Take 2 tablets (80 mg total) by mouth at night. 180 tablet 3   TRAVATAN Z 0.004 % SOLN ophthalmic solution Place 1 drop into both eyes at bedtime.     Travoprost, BAK Free, (TRAVATAN) 0.004 % SOLN ophthalmic solution Place 1 drop into both eyes every evening. 10 mL 3   mupirocin ointment (BACTROBAN) 2 % Place 1 Application into the nose 2 (  two) times daily. 22 g 0   No facility-administered medications prior to visit.   Past Medical History:  Diagnosis Date   Anxiety    Complication of anesthesia    Depression    GERD (gastroesophageal reflux disease)    Glaucoma    Hyperglycemia    Hyperlipidemia    Hypertension    Insomnia    Obesity    PONV (postoperative nausea and vomiting)    with ankle surgery   Vitamin D deficiency     Past Surgical History:  Procedure Laterality Date   ANKLE SURGERY Left    CATARACT EXTRACTION, BILATERAL N/A 2021   CHOLECYSTECTOMY     COLONOSCOPY WITH PROPOFOL N/A 07/01/2016   Procedure: COLONOSCOPY WITH PROPOFOL;  Surgeon: Charolett Bumpers, MD;  Location: [facility];  Service: Endoscopy;  Laterality: N/A;   Allergies  Allergen Reactions   Amlodipine     Blurry vision   Atenolol     Bradycardia    Carvedilol     Bradycardia   Clonidine Derivatives     Weakness, dizziness, raspy voice, salivating   Hydralazine     dizziness   Losartan Potassium-Hctz     Fatigue, tremor   Nifedipine     Made rash worse, weakness   Spironolactone     Weakness on 25mg  daily   Telmisartan     Heart raced on 80mg  tab. Tolerates 2 of the 40mg  tabs.      Objective:    Physical Exam Vitals and nursing note reviewed.  Constitutional:      Appearance: Normal appearance.  Cardiovascular:     Rate and Rhythm: Normal rate and regular rhythm.  Pulmonary:     Effort: Pulmonary effort is normal.     Breath sounds: Normal breath sounds.  Musculoskeletal:        General: Normal range of motion.  Skin:    General: Skin is warm and dry.  Neurological:     Mental Status: She is alert.  Psychiatric:        Mood and Affect: Mood normal.        Behavior: Behavior normal.   BP (!) 158/77 (BP Location: Left Arm, Patient Position: Sitting, Cuff Size: Large)   Pulse 64   Temp 98 F (36.7 C) (Temporal)   Ht 5\' 3"  (1.6 m)   Wt 143 lb 6 oz (65 kg)   SpO2 98%   BMI 25.40 kg/m  Wt Readings from Last 3 Encounters:  10/18/22 143 lb 6 oz (65 kg)  10/08/22 144 lb 9.6 oz (65.6 kg)  08/27/22 144 lb 3.2 oz (65.4 kg)      Dulce Sellar, NP

## 2022-10-18 NOTE — Patient Instructions (Signed)
It was very nice to see you today!   Start taking the Hydralazine 10mg  3 times per day, continue monitoring BP & let Dr. Ruthine Dose if still running high.  Take 1/2 pill of your Alprazolam in am. I have sent over Hydroxyzine to take for your nerves/anxiety during the day, up to 3 times. The 3rd dose can be taken with the rest of your Alprazolam to help with sleep.  Follow up with Dr Ruthine Dose as soon as you can.      PLEASE NOTE:  If you had any lab tests please let us know if you have not heard back within a few days. You may see your results on MyChart before we have a chance to review them but we will give you a call once they are reviewed by [place]. If we ordered any referrals today, please let us know if you have not heard from their office within the next week.

## 2022-10-21 NOTE — Telephone Encounter (Signed)
Patient was advised to go to ED due to how she was feeling, patient declined being seen at ED and insisted on seeing a provider in office if she was unable to see her PCP. Pt seen in office by [place] 10/18/22.

## 2022-10-22 ENCOUNTER — Other Ambulatory Visit: Payer: Self-pay

## 2022-10-23 ENCOUNTER — Encounter: Payer: Self-pay | Admitting: Family Medicine

## 2022-10-23 ENCOUNTER — Other Ambulatory Visit (HOSPITAL_BASED_OUTPATIENT_CLINIC_OR_DEPARTMENT_OTHER): Payer: Self-pay

## 2022-10-23 ENCOUNTER — Ambulatory Visit (INDEPENDENT_AMBULATORY_CARE_PROVIDER_SITE_OTHER): Payer: Medicare PPO | Admitting: Family Medicine

## 2022-10-23 VITALS — BP 146/62 | HR 91 | Temp 97.9°F | Resp 18 | Ht 63.0 in | Wt 141.5 lb

## 2022-10-23 DIAGNOSIS — I10 Essential (primary) hypertension: Secondary | ICD-10-CM

## 2022-10-23 DIAGNOSIS — F411 Generalized anxiety disorder: Secondary | ICD-10-CM

## 2022-10-23 DIAGNOSIS — F5101 Primary insomnia: Secondary | ICD-10-CM | POA: Diagnosis not present

## 2022-10-23 MED ORDER — HYDROXYZINE HCL 10 MG PO TABS
10.0000 mg | ORAL_TABLET | Freq: Three times a day (TID) | ORAL | 0 refills | Status: DC | PRN
Start: 2022-10-23 — End: 2022-12-04
  Filled 2022-10-23 – 2022-10-26 (×3): qty 90, 30d supply, fill #0
  Filled ????-??-??: fill #0

## 2022-10-23 MED ORDER — CLONAZEPAM 2 MG PO TABS
2.0000 mg | ORAL_TABLET | Freq: Two times a day (BID) | ORAL | 0 refills | Status: DC
Start: 2022-10-23 — End: 2022-11-01
  Filled 2022-10-23: qty 60, 30d supply, fill #0

## 2022-10-23 NOTE — Patient Instructions (Addendum)
Clonazepam-1/2 tab tonight but if no response in 1 hr, take the other 1/2 and know the 2mg  dose is fine.  Take clonazepam in am as well.  Check blood pressure twice daily    No alprazolam for now.

## 2022-10-23 NOTE — Progress Notes (Signed)
Subjective:     Patient ID: Kristy Hebert, female    DOB: 1940/08/02, 82 y.o.   MRN: [id number]  Chief Complaint  Patient presents with   Follow-up    Follow-up from appointment on Friday for htn, anxiety, need medication refill    HPI  Pt is accompanied by her husband.  HTN - Pt is on Felodipine 10 mg daily, Telmisartan 40 mg BID, and Hydralazine 10 mg  PRN if her systolic number is above 160. Checking bp tid. Bp's running systolic 140-180s with lower readings in the morning and higher readings in the evening . Sometimes, sbp<140. Reports that on Sunday she was confined to her bed due to her elevated Bp. No ha/dizziness/cp/palp/edema/cough/sob. Bp at initial check was 150/80. Bp at recheck was 146/62.  GAD- Stopped Lexapro 5 mg daily as bp was more elevated. Compliant with Alprazolam 3 mg PRN for sleep. States she is falling asleep, but is waking up frequently. Reports the Atrax 10 mg 3 times a day provides relief for her tremors, but not for her anxiety. Reports it is challenging to manage her anxiety without the help of her husband or being in the security of her home. Wants to be able to get back out.   Health Maintenance Due  Topic Date Due   DEXA SCAN  Never done   DTaP/Tdap/Td (1 - Tdap) 09/24/2007    Past Medical History:  Diagnosis Date   Anxiety    Complication of anesthesia    Depression    GERD (gastroesophageal reflux disease)    Glaucoma    Hyperglycemia    Hyperlipidemia    Hypertension    Insomnia    Obesity    PONV (postoperative nausea and vomiting)    with ankle surgery   Vitamin D deficiency     Past Surgical History:  Procedure Laterality Date   ANKLE SURGERY Left    CATARACT EXTRACTION, BILATERAL N/A 2021   CHOLECYSTECTOMY     COLONOSCOPY WITH PROPOFOL N/A 07/01/2016   Procedure: COLONOSCOPY WITH PROPOFOL;  Surgeon: Charolett Bumpers, MD;  Location: [facility];  Service: Endoscopy;  Laterality: N/A;     Current Outpatient Medications:     alprazolam (XANAX) 2 MG tablet, Take 1.5 tablets (3 mg total) by mouth at bedtime as needed for sleep., Disp: 45 tablet, Rfl: 2   clonazePAM (KLONOPIN) 2 MG tablet, Take 1 tablet (2 mg total) by mouth 2 (two) times daily., Disp: 60 tablet, Rfl: 0   dorzolamide-timolol (COSOPT) 2-0.5 % ophthalmic solution, Place 1 drop into both eyes 2 (two) times daily., Disp: 20 mL, Rfl: 2   dorzolamide-timolol (COSOPT) 22.3-6.8 MG/ML ophthalmic solution, Place 1 drop into both eyes 2 (two) times daily., Disp: , Rfl:    felodipine (PLENDIL) 10 MG 24 hr tablet, Take 1 tablet (10 mg total) by mouth daily., Disp: 90 tablet, Rfl: 3   fluticasone (FLONASE) 50 MCG/ACT nasal spray, Place 1 spray into both nostrils daily., Disp: 16 g, Rfl: 11   hydrALAZINE (APRESOLINE) 10 MG tablet, Take 1-2 tablets (10-20 mg total) by mouth 2 (two) times daily. Can take an extra 1-2 tablets as needed for systolic BP > 160, Disp: 150 tablet, Rfl: 3   telmisartan (MICARDIS) 40 MG tablet, Take 2 tablets (80 mg total) by mouth at night., Disp: 180 tablet, Rfl: 3   TRAVATAN Z 0.004 % SOLN ophthalmic solution, Place 1 drop into both eyes at bedtime., Disp: , Rfl:    Travoprost, BAK Free, (TRAVATAN)  0.004 % SOLN ophthalmic solution, Place 1 drop into both eyes every evening., Disp: 10 mL, Rfl: 3   hydrOXYzine (ATARAX) 10 MG tablet, Take 1 tablet (10 mg total) by mouth 3 (three) times daily as needed., Disp: 90 tablet, Rfl: 0  Allergies  Allergen Reactions   Hydralazine     dizziness   Telmisartan     Heart raced on 80mg  tab. Tolerates 2 of the 40mg  tabs.   ROS neg/noncontributory except as noted HPI/below      Objective:     BP (!) 146/62 (BP Location: Left Arm, Patient Position: Sitting, Cuff Size: Large)   Pulse 91   Temp 97.9 F (36.6 C) (Temporal)   Resp 18   Ht 5\' 3"  (1.6 m)   Wt 141 lb 8 oz (64.2 kg)   SpO2 97%   BMI 25.07 kg/m  Wt Readings from Last 3 Encounters:  10/23/22 141 lb 8 oz (64.2 kg)  10/18/22 143 lb 6  oz (65 kg)  10/08/22 144 lb 9.6 oz (65.6 kg)    Physical Exam   Gen: WDWN NAD HEENT: NCAT, conjunctiva not injected, sclera nonicteric CARDIAC: RRR, S1S2+, no murmur. DP 2+B ABDOMEN:  BS+, soft, NTND, No HSM, no masses EXT:  no edema MSK: no gross abnormalities.  NEURO: A&O x3.  CN II-XII intact. Mild hand tremors PSYCH: anxious mood. Good eye contact  Pdmp checked    Assessment & Plan:  Primary hypertension Assessment & Plan: Chronic. Now, not well controlled  Continue felodipine 10 mg daily, telmisartan 40 mg (2 tabs at at bedtime). Seeing Pharm D.  Not taking hydralazine regularly d/t SE-only if needs, but upset having to check blood pressures 3 times daily.  She has multiple sensitivities to multiple medications.  This regimen was finally working-but flaring now due to increased anxiety.  Managed by hypertension clinic.  She has had a renal ultrasound done that was negative.  Urine for pheochromocytoma was negative in the past.  Continue this regimen and monitoring blood pressures.   Generalized anxiety disorder Assessment & Plan: Chronic.  Not well-controlled.  She has been taking high-dose Xanax for over 30 years.  We had discussed this at her first visit with [place].  Advised I am not comfortable with this much Xanax and that is not how to treat anxiety.  Things have really spiraled downhill since stopping her daytime doses.  She has been doing fair on hydroxyzine 10 mg 3 times daily, but had elevated blood pressures on SSRIs (not sure if due to SSRI versus increased anxiety).  As things are spiraling, I would like to try clonazepam 1 to 2 mg in the morning.  Aware that it may or may not make her drowsy.  We will have to try it and see how she does.  Needs to monitor blood pressures, but I think her anxiety is contributing to fluctuations in her blood pressure.  We did discuss but I think that may be the next step.  Orders: -     hydrOXYzine HCl; Take 1 tablet (10 mg total) by mouth 3  (three) times daily as needed.  Dispense: 90 tablet; Refill: 0 -     clonazePAM; Take 1 tablet (2 mg total) by mouth 2 (two) times daily.  Dispense: 60 tablet; Refill: 0  Primary insomnia Assessment & Plan: Chronic.  Patient has been taking Xanax for greater than 50 years.  She has tried some other medicines, but either did not work or had side effects.  We discussed the risks.  "I am fine".  Does not want to change medications-but now, is waking up several times a night despite Xanax 4 mg.   "I need to sleep".  PDMP was checked.  Discussed that Xanax is short acting and therefore, I am not surprised she is waking up.  I would like to change her to clonazepam 2 mg at at bedtime.  Eventually, trying other agents again, but her anxiety overall these changes is exacerbating.  She will follow-up next week with progress    Return in about 1 week (around 10/30/2022) for cancel tomoorw w/Alyssa.  keep November w/me.  and me next wk.  Germaine Pomfret Rice,acting as a scribe for Angelena Sole, MD.,have documented all relevant documentation on the behalf of Angelena Sole, MD,as directed by  Angelena Sole, MD while in the presence of Angelena Sole, MD.  I, Angelena Sole, MD, have reviewed all documentation for this visit. The documentation on 10/24/22 for the exam, diagnosis, procedures, and orders are all accurate and complete.   Angelena Sole, MD

## 2022-10-24 ENCOUNTER — Encounter: Payer: Self-pay | Admitting: Family Medicine

## 2022-10-24 ENCOUNTER — Telehealth: Payer: Self-pay | Admitting: Family Medicine

## 2022-10-24 ENCOUNTER — Ambulatory Visit: Payer: Medicare PPO | Admitting: Physician Assistant

## 2022-10-24 NOTE — Telephone Encounter (Signed)
Patient notified of message below.

## 2022-10-24 NOTE — Telephone Encounter (Signed)
Please see message below

## 2022-10-24 NOTE — Assessment & Plan Note (Signed)
Chronic.  Not well-controlled.  She has been taking high-dose Xanax for over 30 years.  We had discussed this at her first visit with [place].  Advised I am not comfortable with this much Xanax and that is not how to treat anxiety.  Things have really spiraled downhill since stopping her daytime doses.  She has been doing fair on hydroxyzine 10 mg 3 times daily, but had elevated blood pressures on SSRIs (not sure if due to SSRI versus increased anxiety).  As things are spiraling, I would like to try clonazepam 1 to 2 mg in the morning.  Aware that it may or may not make her drowsy.  We will have to try it and see how she does.  Needs to monitor blood pressures, but I think her anxiety is contributing to fluctuations in her blood pressure.  We did discuss but I think that may be the next step.

## 2022-10-24 NOTE — Assessment & Plan Note (Signed)
Chronic. Now, not well controlled  Continue felodipine 10 mg daily, telmisartan 40 mg (2 tabs at at bedtime). Seeing Pharm D.  Not taking hydralazine regularly d/t SE-only if needs, but upset having to check blood pressures 3 times daily.  She has multiple sensitivities to multiple medications.  This regimen was finally working-but flaring now due to increased anxiety.  Managed by hypertension clinic.  She has had a renal ultrasound done that was negative.  Urine for pheochromocytoma was negative in the past.  Continue this regimen and monitoring blood pressures.

## 2022-10-24 NOTE — Assessment & Plan Note (Signed)
Chronic.  Patient has been taking Xanax for greater than 50 years.  She has tried some other medicines, but either did not work or had side effects.  We discussed the risks.  "I am fine".  Does not want to change medications-but now, is waking up several times a night despite Xanax 4 mg.   "I need to sleep".  PDMP was checked.  Discussed that Xanax is short acting and therefore, I am not surprised she is waking up.  I would like to change her to clonazepam 2 mg at at bedtime.  Eventually, trying other agents again, but her anxiety overall these changes is exacerbating.  She will follow-up next week with progress

## 2022-10-24 NOTE — Telephone Encounter (Signed)
Pt state she took  clonazePAM (KLONOPIN) 2 MG tablet  Last night and it did not help her fall asleep. Please advise.

## 2022-10-25 ENCOUNTER — Encounter: Payer: Self-pay | Admitting: Family Medicine

## 2022-10-25 ENCOUNTER — Encounter (HOSPITAL_BASED_OUTPATIENT_CLINIC_OR_DEPARTMENT_OTHER): Payer: Self-pay

## 2022-10-25 ENCOUNTER — Other Ambulatory Visit (HOSPITAL_BASED_OUTPATIENT_CLINIC_OR_DEPARTMENT_OTHER): Payer: Self-pay

## 2022-10-26 ENCOUNTER — Other Ambulatory Visit (HOSPITAL_BASED_OUTPATIENT_CLINIC_OR_DEPARTMENT_OTHER): Payer: Self-pay

## 2022-10-30 ENCOUNTER — Other Ambulatory Visit (HOSPITAL_BASED_OUTPATIENT_CLINIC_OR_DEPARTMENT_OTHER): Payer: Self-pay

## 2022-10-30 MED ORDER — INFLUENZA VAC A&B SURF ANT ADJ 0.5 ML IM SUSY
0.5000 mL | PREFILLED_SYRINGE | Freq: Once | INTRAMUSCULAR | 0 refills | Status: AC
  Filled 2022-10-30: qty 0.5, 1d supply, fill #0

## 2022-10-31 NOTE — Progress Notes (Signed)
Subjective:     Patient ID: Kristy Hebert, female    DOB: [birth year], 82 y.o.   MRN: [id number]  Chief Complaint  Patient presents with   Hypertension    Pt here for 1 wk f/u of hypertension     HPI  HTN - Pt is on Felodipine 10 mg daily, Telmisartan 40 mg BID, and Hydralazine 10 mg PRN if her systolic number is above 160 .  Bp's running 120's-140/60-70's.  No ha/dizziness/cp/palp/edema/cough/sob. Feels "more like self"  GAD - no alprazolam.  Clonazepam 1.5 tabs at hs.  None during day.  Hydroxyzine 10mg  tid prn, but doing great!.  Taking daily now prn.  Tremors better.  If stays busy, not drowsy.  No SI  telmisartan - getting thick mucus in throat at night.    There are no preventive care reminders to display for this patient.   Past Medical History:  Diagnosis Date   Anxiety    Complication of anesthesia    Depression    GERD (gastroesophageal reflux disease)    Glaucoma    Hyperglycemia    Hyperlipidemia    Hypertension    Insomnia    Obesity    PONV (postoperative nausea and vomiting)    with ankle surgery   Vitamin D deficiency     Past Surgical History:  Procedure Laterality Date   ANKLE SURGERY Left    CATARACT EXTRACTION, BILATERAL N/A 2021   CHOLECYSTECTOMY     COLONOSCOPY WITH PROPOFOL N/A 07/01/2016   Procedure: COLONOSCOPY WITH PROPOFOL;  Surgeon: Charolett Bumpers, MD;  Location: [facility];  Service: Endoscopy;  Laterality: N/A;     Current Outpatient Medications:    clonazePAM (KLONOPIN) 1 MG tablet, Take 1 tablet (1 mg total) by mouth at bedtime. Take a 1mg  and 2mg  at hs, Disp: 30 tablet, Rfl: 1   dorzolamide-timolol (COSOPT) 2-0.5 % ophthalmic solution, Place 1 drop into both eyes 2 (two) times daily., Disp: 20 mL, Rfl: 2   dorzolamide-timolol (COSOPT) 22.3-6.8 MG/ML ophthalmic solution, Place 1 drop into both eyes 2 (two) times daily., Disp: , Rfl:    felodipine (PLENDIL) 10 MG 24 hr tablet, Take 1 tablet (10 mg total) by mouth daily.,  Disp: 90 tablet, Rfl: 3   fluticasone (FLONASE) 50 MCG/ACT nasal spray, Place 1 spray into both nostrils daily., Disp: 16 g, Rfl: 11   hydrALAZINE (APRESOLINE) 10 MG tablet, Take 1-2 tablets (10-20 mg total) by mouth 2 (two) times daily. Can take an extra 1-2 tablets as needed for systolic BP > 160, Disp: 150 tablet, Rfl: 3   hydrOXYzine (ATARAX) 10 MG tablet, Take 1 tablet (10 mg total) by mouth 3 (three) times daily as needed., Disp: 90 tablet, Rfl: 0   telmisartan (MICARDIS) 40 MG tablet, Take 2 tablets (80 mg total) by mouth at night., Disp: 180 tablet, Rfl: 3   TRAVATAN Z 0.004 % SOLN ophthalmic solution, Place 1 drop into both eyes at bedtime., Disp: , Rfl:    Travoprost, BAK Free, (TRAVATAN) 0.004 % SOLN ophthalmic solution, Place 1 drop into both eyes every evening., Disp: 10 mL, Rfl: 3   clonazePAM (KLONOPIN) 2 MG tablet, Take 1 tablet (2 mg total) by mouth at bedtime. Take a 2mg  and 1mg  tablet at night, Disp: 30 tablet, Rfl: 1  Allergies  Allergen Reactions   Hydralazine     dizziness   Telmisartan     Heart raced on 80mg  tab. Tolerates 2 of the 40mg  tabs.   ROS  neg/noncontributory except as noted HPI/below      Objective:     BP (!) 140/70 (BP Location: Left Arm, Patient Position: Sitting, Cuff Size: Normal)   Pulse 80   Temp 97.6 F (36.4 C)   Ht 5\' 3"  (1.6 m)   Wt 143 lb 3.2 oz (65 kg)   SpO2 97%   BMI 25.37 kg/m  Wt Readings from Last 3 Encounters:  11/01/22 143 lb 3.2 oz (65 kg)  10/23/22 141 lb 8 oz (64.2 kg)  10/18/22 143 lb 6 oz (65 kg)    Physical Exam   Gen: WDWN NAD HEENT: NCAT, conjunctiva not injected, sclera nonicteric NECK:  supple, no thyromegaly, no nodes, no carotid bruits CARDIAC: RRR, S1S2+, no murmur.  EXT:  no edema MSK: no gross abnormalities.  NEURO: A&O x3.  CN II-XII intact.  PSYCH: normal mood-calm. Good eye contact     Assessment & Plan:  Primary hypertension  Generalized anxiety disorder -     clonazePAM; Take 1 tablet (2 mg  total) by mouth at bedtime. Take a 2mg  and 1mg  tablet at night  Dispense: 30 tablet; Refill: 1 -     clonazePAM; Take 1 tablet (1 mg total) by mouth at bedtime. Take a 1mg  and 2mg  at hs  Dispense: 30 tablet; Refill: 1    No follow-ups on file.   I, Isabelle Course, acting as a scribe for Angelena Sole, MD., have documented all relevant documentation on the behalf of Angelena Sole, MD, as directed by  Angelena Sole, MD while in the presence of Angelena Sole, MD.  I, Angelena Sole, MD, have reviewed all documentation for this visit. The documentation on 11/01/22 for the exam, diagnosis, procedures, and orders are all accurate and complete.     Angelena Sole, MD

## 2022-11-01 ENCOUNTER — Ambulatory Visit: Payer: Medicare PPO | Admitting: Family Medicine

## 2022-11-01 ENCOUNTER — Encounter: Payer: Self-pay | Admitting: Family Medicine

## 2022-11-01 ENCOUNTER — Other Ambulatory Visit (HOSPITAL_BASED_OUTPATIENT_CLINIC_OR_DEPARTMENT_OTHER): Payer: Self-pay

## 2022-11-01 VITALS — BP 140/70 | HR 80 | Temp 97.6°F | Ht 63.0 in | Wt 143.2 lb

## 2022-11-01 DIAGNOSIS — I10 Essential (primary) hypertension: Secondary | ICD-10-CM

## 2022-11-01 DIAGNOSIS — F411 Generalized anxiety disorder: Secondary | ICD-10-CM | POA: Diagnosis not present

## 2022-11-01 MED ORDER — CLONAZEPAM 2 MG PO TABS
2.0000 mg | ORAL_TABLET | Freq: Every day | ORAL | 1 refills | Status: DC
Start: 2022-11-01 — End: 2022-12-04
  Filled 2022-11-01 – 2022-11-15 (×3): qty 30, 30d supply, fill #0

## 2022-11-01 MED ORDER — CLONAZEPAM 1 MG PO TABS
1.0000 mg | ORAL_TABLET | Freq: Every day | ORAL | 1 refills | Status: DC
Start: 2022-11-01 — End: 2022-12-04
  Filled 2022-11-01: qty 30, 30d supply, fill #0

## 2022-11-01 NOTE — Assessment & Plan Note (Signed)
Chronic. controlled  Continue felodipine 10 mg daily, telmisartan 40 mg (2 tabs at at bedtime).

## 2022-11-01 NOTE — Patient Instructions (Signed)

## 2022-11-01 NOTE — Assessment & Plan Note (Signed)
Chronic. Controlled.  Cont Klonopin 3mg  at HS

## 2022-11-02 ENCOUNTER — Other Ambulatory Visit (HOSPITAL_BASED_OUTPATIENT_CLINIC_OR_DEPARTMENT_OTHER): Payer: Self-pay

## 2022-11-06 ENCOUNTER — Other Ambulatory Visit (HOSPITAL_BASED_OUTPATIENT_CLINIC_OR_DEPARTMENT_OTHER): Payer: Self-pay

## 2022-11-08 ENCOUNTER — Other Ambulatory Visit (HOSPITAL_BASED_OUTPATIENT_CLINIC_OR_DEPARTMENT_OTHER): Payer: Self-pay

## 2022-11-08 ENCOUNTER — Other Ambulatory Visit: Payer: Self-pay | Admitting: Family Medicine

## 2022-11-08 ENCOUNTER — Other Ambulatory Visit: Payer: Self-pay

## 2022-11-08 DIAGNOSIS — Z1231 Encounter for screening mammogram for malignant neoplasm of breast: Secondary | ICD-10-CM

## 2022-11-11 ENCOUNTER — Ambulatory Visit (INDEPENDENT_AMBULATORY_CARE_PROVIDER_SITE_OTHER): Payer: Medicare PPO

## 2022-11-11 VITALS — Wt 143.0 lb

## 2022-11-11 DIAGNOSIS — Z0000 Encounter for general adult medical examination without abnormal findings: Secondary | ICD-10-CM

## 2022-11-11 NOTE — Patient Instructions (Signed)
Ms. Bubak , Thank you for taking time to come for your Medicare Wellness Visit. I appreciate your ongoing commitment to your health goals. Please review the following plan we discussed and let me know if I can assist you in the future.   Referrals/Orders/Follow-Ups/Clinician Recommendations: continue to walk daily  Aim for 30 minutes of exercise or brisk walking, 6-8 glasses of water, and 5 servings of fruits and vegetables each day.   This is a list of the screening recommended for you and due dates:  Health Maintenance  Topic Date Due   Medicare Annual Wellness Visit  07/04/2022   Zoster (Shingles) Vaccine (1 of 2) 01/07/2023*   DTaP/Tdap/Td vaccine (1 - Tdap) 11/01/2023*   DEXA scan (bone density measurement)  11/01/2023*   Pneumonia Vaccine  Completed   Flu Shot  Completed   COVID-19 Vaccine  Completed   HPV Vaccine  Aged Out  *Topic was postponed. The date shown is not the original due date.    Advanced directives: (In Chart) A copy of your advanced directives are scanned into your chart should your provider ever need it.  Next Medicare Annual Wellness Visit scheduled for next year: Yes

## 2022-11-11 NOTE — Progress Notes (Signed)
Subjective:   Kristy Hebert is a 82 y.o. female who presents for Medicare Annual (Subsequent) preventive examination.  Visit Complete: Virtual I connected with  Kristy Hebert on 11/11/22 by a audio enabled telemedicine application and verified that I am speaking with the correct person using two identifiers.  Patient Location: Home  Provider Location: Office/Clinic  I discussed the limitations of evaluation and management by telemedicine. The patient expressed understanding and agreed to proceed.  Vital Signs: Because this visit was a virtual/telehealth visit, some criteria may be missing or patient reported. Any vitals not documented were not able to be obtained and vitals that have been documented are patient reported.  Patient Medicare AWV questionnaire was completed by the patient on 11/04/22; I have confirmed that all information answered by patient is correct and no changes since this date.  Cardiac Risk Factors include: advanced age (>75men, >71 women);dyslipidemia;hypertension     Objective:    Today's Vitals   11/11/22 1402  Weight: 143 lb (64.9 kg)   Body mass index is 25.33 kg/m.     11/11/2022    2:09 PM 07/01/2016    7:26 AM 06/28/2016    3:56 PM  Advanced Directives  Does Patient Have a Medical Advance Directive? Yes Yes Yes  Type of Estate agent of [place];Living will Living will Living will  Does patient want to make changes to medical advance directive? No - Patient declined  No - Patient declined  Copy of Healthcare Power of Attorney in Chart? Yes - validated most recent copy scanned in chart (See row information) No - copy requested     Current Medications (verified) Outpatient Encounter Medications as of 11/11/2022  Medication Sig   clonazePAM (KLONOPIN) 1 MG tablet Take 1 tablet (1 mg total) by mouth at bedtime. Take a 1mg  and 2mg  at hs   dorzolamide-timolol (COSOPT) 22.3-6.8 MG/ML ophthalmic solution Place 1 drop into  both eyes 2 (two) times daily.   felodipine (PLENDIL) 10 MG 24 hr tablet Take 1 tablet (10 mg total) by mouth daily.   fluticasone (FLONASE) 50 MCG/ACT nasal spray Place 1 spray into both nostrils daily.   hydrALAZINE (APRESOLINE) 10 MG tablet Take 1-2 tablets (10-20 mg total) by mouth 2 (two) times daily. Can take an extra 1-2 tablets as needed for systolic BP > 160   hydrOXYzine (ATARAX) 10 MG tablet Take 1 tablet (10 mg total) by mouth 3 (three) times daily as needed.   telmisartan (MICARDIS) 40 MG tablet Take 2 tablets (80 mg total) by mouth at night.   TRAVATAN Z 0.004 % SOLN ophthalmic solution Place 1 drop into both eyes at bedtime.   clonazePAM (KLONOPIN) 2 MG tablet Take 1 tablet (2 mg total) by mouth at bedtime. Take a 2mg  and 1mg  tablet at night   dorzolamide-timolol (COSOPT) 2-0.5 % ophthalmic solution Place 1 drop into both eyes 2 (two) times daily. (Patient not taking: Reported on 11/11/2022)   Travoprost, BAK Free, (TRAVATAN) 0.004 % SOLN ophthalmic solution Place 1 drop into both eyes every evening. (Patient not taking: Reported on 11/11/2022)   No facility-administered encounter medications on file as of 11/11/2022.    Allergies (verified) Hydralazine and Telmisartan   History: Past Medical History:  Diagnosis Date   Anxiety    Complication of anesthesia    Depression    GERD (gastroesophageal reflux disease)    Glaucoma    Hyperglycemia    Hyperlipidemia    Hypertension    Insomnia  Obesity    PONV (postoperative nausea and vomiting)    with ankle surgery   Vitamin D deficiency    Past Surgical History:  Procedure Laterality Date   ANKLE SURGERY Left    CATARACT EXTRACTION, BILATERAL N/A 2021   CHOLECYSTECTOMY     COLONOSCOPY WITH PROPOFOL N/A 07/01/2016   Procedure: COLONOSCOPY WITH PROPOFOL;  Surgeon: Charolett Bumpers, MD;  Location: [facility];  Service: Endoscopy;  Laterality: N/A;   Family History  Problem Relation Age of Onset   Hypertension  Mother    Social History   Socioeconomic History   Marital status: Married    Spouse name: Not on file   Number of children: 2   Years of education: Not on file   Highest education level: 12th grade  Occupational History   Not on file  Tobacco Use   Smoking status: Never   Smokeless tobacco: Never  Vaping Use   Vaping status: Never Used  Substance and Sexual Activity   Alcohol use: Never   Drug use: Never   Sexual activity: Not Currently    Partners: Male  Other Topics Concern   Not on file  Social History Narrative   Adopted children.  No grands   Retired Producer, television/film/video.   Social Determinants of Health   Financial Resource Strain: Low Risk  (11/04/2022)   Overall Financial Resource Strain (CARDIA)    Difficulty of Paying Living Expenses: Not hard at all  Food Insecurity: No Food Insecurity (11/04/2022)   Hunger Vital Sign    Worried About Running Out of Food in the Last Year: Never true    Ran Out of Food in the Last Year: Never true  Transportation Needs: No Transportation Needs (10/31/2022)   PRAPARE - Administrator, Civil Service (Medical): No    Lack of Transportation (Non-Medical): No  Physical Activity: Insufficiently Active (11/04/2022)   Exercise Vital Sign    Days of Exercise per Week: 6 days    Minutes of Exercise per Session: 20 min  Stress: No Stress Concern Present (11/04/2022)   [employer] of Occupational Health - Occupational Stress Questionnaire    Feeling of Stress : Only a little  Recent Concern: Stress - Stress Concern Present (10/18/2022)   [employer] of Occupational Health - Occupational Stress Questionnaire    Feeling of Stress : Very much  Social Connections: Moderately Isolated (11/04/2022)   Social Connection and Isolation Panel [NHANES]    Frequency of Communication with Friends and Family: Twice a week    Frequency of Social Gatherings with Friends and Family: Twice a week    Attends Religious Services: Never     Database administrator or Organizations: No    Attends Engineer, structural: Never    Marital Status: Married    Tobacco Counseling Counseling given: Not Answered   Clinical Intake:  Pre-visit preparation completed: Yes  Pain : No/denies pain     BMI - recorded: 25.33 Nutritional Status: BMI 25 -29 Overweight Nutritional Risks: None Diabetes: No  How often do you need to have someone help you when you read instructions, pamphlets, or other written materials from your doctor or pharmacy?: 1 - Never  Interpreter Needed?: No  Information entered by :: Lanier Ensign, LPN   Activities of Daily Living    11/04/2022   10:32 AM  In your present state of health, do you have any difficulty performing the following activities:  Hearing? 0  Vision? 0  Difficulty  concentrating or making decisions? 0  Walking or climbing stairs? 0  Dressing or bathing? 0  Doing errands, shopping? 0  Preparing Food and eating ? N  Using the Toilet? N  In the past six months, have you accidently leaked urine? N  Do you have problems with loss of bowel control? N  Managing your Medications? N  Managing your Finances? N  Housekeeping or managing your Housekeeping? N    Patient Care Team: Jeani Sow, MD as PCP - General (Family Medicine) Jake Bathe, MD as PCP - Cardiology (Cardiology) Janet Berlin, MD as PCP - Ophthalmology (Ophthalmology) Supple, Emeline Darling, RPH-CPP (Pharmacist)  Indicate any recent Medical Services you may have received from other than [facility] providers in the past year (date may be approximate).     Assessment:   This is a routine wellness examination for Letrice.  Hearing/Vision screen Hearing Screening - Comments:: Pt denies any hearing issues  Vision Screening - Comments:: Pt follows up with Dr Burgess Estelle for annual eye exams    Goals Addressed             This Visit's Progress    Patient Stated       Get back walking for exercise         Depression Screen    11/11/2022    2:08 PM 11/01/2022    1:03 PM 10/23/2022    1:41 PM 10/08/2022   11:13 AM 08/27/2022   10:03 AM  PHQ 2/9 Scores  PHQ - 2 Score 0 0 4 4 0  PHQ- 9 Score 0 0 10 20 1     Fall Risk    11/04/2022   10:32 AM 11/01/2022    1:02 PM 10/23/2022    1:40 PM 10/08/2022   11:13 AM 08/27/2022   10:03 AM  Fall Risk   Falls in the past year? 0 0 0 0 0  Number falls in past yr:  0 0 0 0  Injury with Fall?  0 0 0 0  Risk for fall due to : No Fall Risks No Fall Risks No Fall Risks No Fall Risks No Fall Risks  Follow up Falls prevention discussed Falls evaluation completed Falls evaluation completed Falls evaluation completed Falls evaluation completed    MEDICARE RISK AT HOME: Medicare Risk at Home Any stairs in or around the home?: No Home free of loose throw rugs in walkways, pet beds, electrical cords, etc?: Yes Adequate lighting in your home to reduce risk of falls?: Yes Life alert?: No Use of a cane, walker or w/c?: No Grab bars in the bathroom?: Yes Shower chair or bench in shower?: Yes Elevated toilet seat or a handicapped toilet?: No  TIMED UP AND GO:  Was the test performed?  No    Cognitive Function:        11/11/2022    2:10 PM  6CIT Screen  What Year? 0 points  What month? 0 points  What time? 0 points  Count back from 20 0 points  Months in reverse 0 points  Repeat phrase 0 points  Total Score 0 points    Immunizations Immunization History  Administered Date(s) Administered   Fluad Quad(high Dose 65+) 09/15/2018   Fluad Trivalent(High Dose 65+) 10/30/2022   Influenza Split 11/25/2008, 10/30/2009, 10/01/2010, 09/30/2012, 12/04/2012, 10/01/2013, 10/04/2014, 09/15/2018, 09/27/2019   Influenza, High Dose Seasonal PF 09/30/2016, 09/20/2018   Influenza-Unspecified 09/23/2011, 09/14/2013   PFIZER(Purple Top)SARS-COV-2 Vaccination 03/08/2019, 03/29/2019, 10/17/2019   PNEUMOCOCCAL CONJUGATE-20 07/18/2021   Pneumococcal  Conjugate-13  03/30/2015   Pneumococcal Polysaccharide-23 01/25/2009   Td (Adult) 09/23/2007   Zoster, Live 02/27/2009    TDAP status: Due, Education has been provided regarding the importance of this vaccine. Advised may receive this vaccine at local pharmacy or Health Dept. Aware to provide a copy of the vaccination record if obtained from local pharmacy or Health Dept. Verbalized acceptance and understanding.  Flu Vaccine status: Up to date  Pneumococcal vaccine status: Up to date  Covid-19 vaccine status: Completed vaccines  Qualifies for Shingles Vaccine? Yes   Zostavax completed No   Shingrix Completed?: No.    Education has been provided regarding the importance of this vaccine. Patient has been advised to call insurance company to determine out of pocket expense if they have not yet received this vaccine. Advised may also receive vaccine at local pharmacy or Health Dept. Verbalized acceptance and understanding.  Screening Tests Health Maintenance  Topic Date Due   Zoster Vaccines- Shingrix (1 of 2) 01/07/2023 (Originally 08/18/1959)   DTaP/Tdap/Td (1 - Tdap) 11/01/2023 (Originally 09/24/2007)   DEXA SCAN  11/01/2023 (Originally 08/17/2005)   Medicare Annual Wellness (AWV)  11/11/2023   Pneumonia Vaccine 18+ Years old  Completed   INFLUENZA VACCINE  Completed   COVID-19 Vaccine  Completed   HPV VACCINES  Aged Out    Health Maintenance  There are no preventive care reminders to display for this patient.   Colorectal cancer screening: No longer required.   Mammogram status: Completed 12/24/22. Repeat every year     Additional Screening:   Vision Screening: Recommended annual ophthalmology exams for early detection of glaucoma and other disorders of the eye. Is the patient up to date with their annual eye exam?  Yes  Who is the provider or what is the name of the office in which the patient attends annual eye exams? Dr Burgess Estelle  If pt is not established with a provider, would they  like to be referred to a provider to establish care? No .   Dental Screening: Recommended annual dental exams for proper oral hygiene   Community Resource Referral / Chronic Care Management: CRR required this visit?  No   CCM required this visit?  No     Plan:     I have personally reviewed and noted the following in the patient's chart:   Medical and social history Use of alcohol, tobacco or illicit drugs  Current medications and supplements including opioid prescriptions. Patient is not currently taking opioid prescriptions. Functional ability and status Nutritional status Physical activity Advanced directives List of other physicians Hospitalizations, surgeries, and ER visits in previous 12 months Vitals Screenings to include cognitive, depression, and falls Referrals and appointments  In addition, I have reviewed and discussed with patient certain preventive protocols, quality metrics, and best practice recommendations. A written personalized care plan for preventive services as well as general preventive health recommendations were provided to patient.     Marzella Schlein, LPN   [id number]   After Visit Summary: (MyChart) Due to this being a telephonic visit, the after visit summary with patients personalized plan was offered to patient via MyChart   Nurse Notes: none

## 2022-11-12 ENCOUNTER — Other Ambulatory Visit (HOSPITAL_COMMUNITY): Payer: Self-pay

## 2022-11-12 ENCOUNTER — Other Ambulatory Visit (HOSPITAL_BASED_OUTPATIENT_CLINIC_OR_DEPARTMENT_OTHER): Payer: Self-pay

## 2022-11-12 ENCOUNTER — Other Ambulatory Visit: Payer: Self-pay

## 2022-11-13 ENCOUNTER — Other Ambulatory Visit (HOSPITAL_BASED_OUTPATIENT_CLINIC_OR_DEPARTMENT_OTHER): Payer: Self-pay

## 2022-11-15 ENCOUNTER — Other Ambulatory Visit (HOSPITAL_BASED_OUTPATIENT_CLINIC_OR_DEPARTMENT_OTHER): Payer: Self-pay

## 2022-11-27 ENCOUNTER — Ambulatory Visit: Payer: Medicare PPO | Admitting: Family Medicine

## 2022-12-04 ENCOUNTER — Other Ambulatory Visit (HOSPITAL_BASED_OUTPATIENT_CLINIC_OR_DEPARTMENT_OTHER): Payer: Self-pay

## 2022-12-04 ENCOUNTER — Encounter: Payer: Self-pay | Admitting: Family Medicine

## 2022-12-04 ENCOUNTER — Ambulatory Visit: Payer: Medicare PPO | Admitting: Family Medicine

## 2022-12-04 VITALS — BP 152/82 | HR 72 | Temp 98.3°F | Resp 16 | Ht 63.0 in | Wt 149.2 lb

## 2022-12-04 DIAGNOSIS — Z79899 Other long term (current) drug therapy: Secondary | ICD-10-CM

## 2022-12-04 DIAGNOSIS — F411 Generalized anxiety disorder: Secondary | ICD-10-CM

## 2022-12-04 DIAGNOSIS — I10 Essential (primary) hypertension: Secondary | ICD-10-CM | POA: Diagnosis not present

## 2022-12-04 DIAGNOSIS — F5101 Primary insomnia: Secondary | ICD-10-CM | POA: Diagnosis not present

## 2022-12-04 MED ORDER — HYDROXYZINE PAMOATE 25 MG PO CAPS
25.0000 mg | ORAL_CAPSULE | Freq: Three times a day (TID) | ORAL | 2 refills | Status: DC | PRN
  Filled 2022-12-04: qty 60, 20d supply, fill #0

## 2022-12-04 MED ORDER — CLONAZEPAM 2 MG PO TABS
2.0000 mg | ORAL_TABLET | Freq: Every day | ORAL | 1 refills | Status: DC
  Filled 2022-12-04 – 2022-12-13 (×2): qty 30, 30d supply, fill #0
  Filled 2023-01-10: qty 30, 30d supply, fill #1

## 2022-12-04 MED ORDER — CLONAZEPAM 1 MG PO TABS
1.0000 mg | ORAL_TABLET | Freq: Every day | ORAL | 2 refills | Status: DC
  Filled 2022-12-04 – 2022-12-13 (×2): qty 30, 30d supply, fill #0
  Filled 2023-01-10: qty 30, 30d supply, fill #1
  Filled 2023-02-07: qty 30, 30d supply, fill #2

## 2022-12-04 NOTE — Progress Notes (Signed)
Subjective:    Patient ID: Kristy Hebert, female    DOB: 1940-04-17, 82 y.o.   MRN: [id number]  Chief Complaint  Patient presents with   Anxiety    3 month follow-up on anxiety Would like to increase hydrOXYzine (ATARAX) to 25 mg   HPI GAD- Complaint with hydroxyzine 10 mg TID PRN. Would like to increases to 25 mg. Clonazepam 1.5 tabs at hs. Sleep has been well controlled except last night. States her anxiety has been well managed but she is feeling fatigued a bit during the day, but works thru it.  No SI.   HTN - Pt is on  Felodipine 10 mg daily, Telmisartan 40 mg BID, and Hydralazine 10 mg  PRN if her systolic number is above 160(not needing).  Bp's running  109-130/60-70. When her Bp is low, reports feeling fatigued.  No ha/dizziness/cp/palp/edema/cough/sob. Bp at initial check was 158/76. States she has been up since 3 am today which may be why. Bp at recheck was 152/82.  There are no preventive care reminders to display for this patient.  Past Medical History:  Diagnosis Date   Anxiety    Complication of anesthesia    Depression    GERD (gastroesophageal reflux disease)    Glaucoma    Hyperglycemia    Hyperlipidemia    Hypertension    Insomnia    Obesity    PONV (postoperative nausea and vomiting)    with ankle surgery   Vitamin D deficiency     Past Surgical History:  Procedure Laterality Date   ANKLE SURGERY Left    CATARACT EXTRACTION, BILATERAL N/A 2021   CHOLECYSTECTOMY     COLONOSCOPY WITH PROPOFOL N/A 07/01/2016   Procedure: COLONOSCOPY WITH PROPOFOL;  Surgeon: Charolett Bumpers, MD;  Location: [facility];  Service: Endoscopy;  Laterality: N/A;     Current Outpatient Medications:    dorzolamide-timolol (COSOPT) 22.3-6.8 MG/ML ophthalmic solution, Place 1 drop into both eyes 2 (two) times daily., Disp: , Rfl:    felodipine (PLENDIL) 10 MG 24 hr tablet, Take 1 tablet (10 mg total) by mouth daily., Disp: 90 tablet, Rfl: 3   fluticasone (FLONASE) 50  MCG/ACT nasal spray, Place 1 spray into both nostrils daily., Disp: 16 g, Rfl: 11   hydrALAZINE (APRESOLINE) 10 MG tablet, Take 1-2 tablets (10-20 mg total) by mouth 2 (two) times daily. Can take an extra 1-2 tablets as needed for systolic BP > 160, Disp: 150 tablet, Rfl: 3   hydrOXYzine (VISTARIL) 25 MG capsule, Take 1 capsule (25 mg total) by mouth every 8 (eight) hours as needed., Disp: 60 capsule, Rfl: 2   telmisartan (MICARDIS) 40 MG tablet, Take 2 tablets (80 mg total) by mouth at night., Disp: 180 tablet, Rfl: 3   Travoprost, BAK Free, (TRAVATAN) 0.004 % SOLN ophthalmic solution, Place 1 drop into both eyes every evening., Disp: 10 mL, Rfl: 3   clonazePAM (KLONOPIN) 1 MG tablet, Take 1 tablet (1 mg total) by mouth at bedtime. (Take a 1mg  and 2mg  tablet at bedtime), Disp: 30 tablet, Rfl: 2   clonazePAM (KLONOPIN) 2 MG tablet, Take 1 tablet (2 mg total) by mouth at bedtime. Take a 2 mg and 1 mg tablet at night for a total 3 mg dose., Disp: 30 tablet, Rfl: 1  Allergies  Allergen Reactions   Hydralazine     dizziness   Telmisartan     Heart raced on 80mg  tab. Tolerates 2 of the 40mg  tabs.   ROS neg/noncontributory  except as noted HPI/below      Objective:     BP (!) 152/82   Pulse 72   Temp 98.3 F (36.8 C) (Temporal)   Resp 16   Ht 5\' 3"  (1.6 m)   Wt 149 lb 4 oz (67.7 kg)   SpO2 97%   BMI 26.44 kg/m  Wt Readings from Last 3 Encounters:  12/04/22 149 lb 4 oz (67.7 kg)  11/11/22 143 lb (64.9 kg)  11/01/22 143 lb 3.2 oz (65 kg)    Physical Exam   Gen: WDWN NAD HEENT: NCAT, conjunctiva not injected, sclera nonicteric CARDIAC: RRR, S1S2+, no murmur. DP 2+B Lungs: CTAB EXT:  no edema MSK: no gross abnormalities.  NEURO: A&O x3.  CN II-XII intact.  PSYCH: normal mood. Good eye contact     Assessment & Plan:  Primary hypertension Assessment & Plan: Chronic. controlled  Continue felodipine 10 mg daily, telmisartan 40 mg (2 tabs at at bedtime).  Not at goal today, but  has been awake since 3am  Orders: -     CBC with Differential/Platelet -     Comprehensive metabolic panel -     TSH  Generalized anxiety disorder Assessment & Plan: Chronic. Controlled.  Cont Klonopin 3mg  at HS.  Change hydroxyzine to 25mg  tid prn. Pt aware of risk of sedation and age w/meds  Orders: -     clonazePAM; Take 1 tablet (1 mg total) by mouth at bedtime. (Take a 1mg  and 2mg  tablet at bedtime)  Dispense: 30 tablet; Refill: 2 -     clonazePAM; Take 1 tablet (2 mg total) by mouth at bedtime. Take a 2 mg and 1 mg tablet at night for a total 3 mg dose.  Dispense: 30 tablet; Refill: 1  Primary insomnia Assessment & Plan: Chronic.  Patient had been taking Xanax for greater than 50 years.  She has tried some other medicines, but either did not work or had side effects.  We discussed the risks.  "I am fine".  Was waking up frequently despite the Xanax, so changed to clonazepam.  She still needs 3 mg for affective sleep.  Discussed that we need to try other modalities to see if they work.  She can try clonazepam 2 mg at at bedtime plus hydroxyzine 25 mg at bedtime and see if this works.  Her sleep, or lack of, tends to exacerbate her blood pressures.  Up until this morning, she had been doing great.  I wonder if today was partly anxiety of coming in   High risk medication use -     CBC with Differential/Platelet -     Comprehensive metabolic panel -     DRUG MONITORING, PANEL 8 WITH CONFIRMATION, URINE  Other orders -     hydrOXYzine Pamoate; Take 1 capsule (25 mg total) by mouth every 8 (eight) hours as needed.  Dispense: 60 capsule; Refill: 2   Return in about 3 months (around 03/06/2023) for HTN, insomnia.  Germaine Pomfret Rice,acting as a scribe for Angelena Sole, MD.,have documented all relevant documentation on the behalf of Angelena Sole, MD,as directed by  Angelena Sole, MD while in the presence of Angelena Sole, MD.  I, Angelena Sole, MD, have reviewed all documentation for this visit.  The documentation on 12/04/22 for the exam, diagnosis, procedures, and orders are all accurate and complete.   Angelena Sole, MD

## 2022-12-04 NOTE — Assessment & Plan Note (Signed)
Chronic. Controlled.  Cont Klonopin 3mg  at HS.  Change hydroxyzine to 25mg  tid prn. Pt aware of risk of sedation and age w/meds

## 2022-12-04 NOTE — Patient Instructions (Signed)

## 2022-12-04 NOTE — Assessment & Plan Note (Signed)
Chronic.  Patient had been taking Xanax for greater than 50 years.  She has tried some other medicines, but either did not work or had side effects.  We discussed the risks.  "I am fine".  Was waking up frequently despite the Xanax, so changed to clonazepam.  She still needs 3 mg for affective sleep.  Discussed that we need to try other modalities to see if they work.  She can try clonazepam 2 mg at at bedtime plus hydroxyzine 25 mg at bedtime and see if this works.  Her sleep, or lack of, tends to exacerbate her blood pressures.  Up until this morning, she had been doing great.  I wonder if today was partly anxiety of coming in

## 2022-12-04 NOTE — Assessment & Plan Note (Signed)
Chronic. controlled  Continue felodipine 10 mg daily, telmisartan 40 mg (2 tabs at at bedtime).  Not at goal today, but has been awake since 3am

## 2022-12-05 ENCOUNTER — Other Ambulatory Visit (HOSPITAL_BASED_OUTPATIENT_CLINIC_OR_DEPARTMENT_OTHER): Payer: Self-pay

## 2022-12-05 LAB — CBC WITH DIFFERENTIAL/PLATELET
Basophils Absolute: 0 10*3/uL (ref 0.0–0.1)
Basophils Relative: 0.6 % (ref 0.0–3.0)
Eosinophils Absolute: 0.1 10*3/uL (ref 0.0–0.7)
Eosinophils Relative: 1.3 % (ref 0.0–5.0)
HCT: 45.3 % (ref 36.0–46.0)
Hemoglobin: 15.1 g/dL — ABNORMAL HIGH (ref 12.0–15.0)
Lymphocytes Relative: 34.4 % (ref 12.0–46.0)
Lymphs Abs: 1.3 10*3/uL (ref 0.7–4.0)
MCHC: 33.3 g/dL (ref 30.0–36.0)
MCV: 96.7 fL (ref 78.0–100.0)
Monocytes Absolute: 0.3 10*3/uL (ref 0.1–1.0)
Monocytes Relative: 6.6 % (ref 3.0–12.0)
Neutro Abs: 2.2 10*3/uL (ref 1.4–7.7)
Neutrophils Relative %: 57.1 % (ref 43.0–77.0)
Platelets: 189 10*3/uL (ref 150.0–400.0)
RBC: 4.68 Mil/uL (ref 3.87–5.11)
RDW: 12.6 % (ref 11.5–15.5)
WBC: 3.9 10*3/uL — ABNORMAL LOW (ref 4.0–10.5)

## 2022-12-05 LAB — COMPREHENSIVE METABOLIC PANEL
ALT: 16 U/L (ref 0–35)
AST: 20 U/L (ref 0–37)
Albumin: 4.6 g/dL (ref 3.5–5.2)
Alkaline Phosphatase: 54 U/L (ref 39–117)
BUN: 18 mg/dL (ref 6–23)
CO2: 26 meq/L (ref 19–32)
Calcium: 9 mg/dL (ref 8.4–10.5)
Chloride: 105 meq/L (ref 96–112)
Creatinine, Ser: 0.8 mg/dL (ref 0.40–1.20)
GFR: 68.68 mL/min (ref >60.00)
Glucose, Bld: 92 mg/dL (ref 70–99)
Potassium: 3.5 meq/L (ref 3.5–5.1)
Sodium: 142 meq/L (ref 135–145)
Total Bilirubin: 0.5 mg/dL (ref 0.2–1.2)
Total Protein: 6.8 g/dL (ref 6.0–8.3)

## 2022-12-05 LAB — DRUG MONITORING, PANEL 8 WITH CONFIRMATION, URINE
6 Acetylmorphine: NEGATIVE ng/mL (ref <10)
Alcohol Metabolites: NEGATIVE ng/mL (ref <500)
Amphetamines: NEGATIVE ng/mL (ref <500)
Benzodiazepines: NEGATIVE ng/mL (ref <100)
Buprenorphine, Urine: NEGATIVE ng/mL (ref <5)
Cocaine Metabolite: NEGATIVE ng/mL (ref <150)
Creatinine: 10.8 mg/dL — ABNORMAL LOW (ref >=20.0)
MDMA: NEGATIVE ng/mL (ref <500)
Marijuana Metabolite: NEGATIVE ng/mL (ref <20)
Opiates: NEGATIVE ng/mL (ref <100)
Oxidant: NEGATIVE ug/mL (ref <200)
Oxycodone: NEGATIVE ng/mL (ref <100)
Specific Gravity: 1.004 (ref >=1.003)
pH: 7.2 (ref 4.5–9.0)

## 2022-12-05 LAB — DM TEMPLATE

## 2022-12-05 LAB — TSH: TSH: 1.98 u[IU]/mL (ref 0.35–5.50)

## 2022-12-05 NOTE — Progress Notes (Signed)
Labs ok except wbc sl low and hgb a little high.  Repeat cbc 1 month(B12 and iron studies as well).  Make sure drinking enough water

## 2022-12-06 ENCOUNTER — Other Ambulatory Visit: Payer: Self-pay | Admitting: *Deleted

## 2022-12-06 DIAGNOSIS — D729 Disorder of white blood cells, unspecified: Secondary | ICD-10-CM

## 2022-12-06 DIAGNOSIS — D582 Other hemoglobinopathies: Secondary | ICD-10-CM

## 2022-12-07 ENCOUNTER — Other Ambulatory Visit: Payer: Self-pay | Admitting: Family Medicine

## 2022-12-07 ENCOUNTER — Other Ambulatory Visit (HOSPITAL_BASED_OUTPATIENT_CLINIC_OR_DEPARTMENT_OTHER): Payer: Self-pay

## 2022-12-07 DIAGNOSIS — F411 Generalized anxiety disorder: Secondary | ICD-10-CM

## 2022-12-09 ENCOUNTER — Telehealth: Payer: Self-pay | Admitting: Family Medicine

## 2022-12-09 ENCOUNTER — Other Ambulatory Visit: Payer: Self-pay | Admitting: Family Medicine

## 2022-12-09 ENCOUNTER — Other Ambulatory Visit (HOSPITAL_BASED_OUTPATIENT_CLINIC_OR_DEPARTMENT_OTHER): Payer: Self-pay

## 2022-12-09 MED ORDER — HYDROXYZINE HCL 10 MG PO TABS
10.0000 mg | ORAL_TABLET | Freq: Three times a day (TID) | ORAL | 0 refills | Status: DC | PRN
  Filled 2022-12-09: qty 30, 10d supply, fill #0

## 2022-12-09 MED ORDER — HYDROXYZINE HCL 10 MG PO TABS
10.0000 mg | ORAL_TABLET | Freq: Three times a day (TID) | ORAL | 2 refills | Status: DC | PRN
  Filled 2022-12-09: qty 60, 20d supply, fill #0
  Filled 2023-01-09: qty 60, 20d supply, fill #1
  Filled 2023-02-24: qty 60, 20d supply, fill #2

## 2022-12-09 NOTE — Telephone Encounter (Signed)
Please see message below and advise.

## 2022-12-09 NOTE — Telephone Encounter (Signed)
Patient states she did not pick up RX for hydroxyzine 25 mg because she states it will make her too drowsy.  Requests RX for Hydroxyzine 10 MG be sent to:  [facility] Phone: [phone]  Fax: [phone]

## 2022-12-13 ENCOUNTER — Other Ambulatory Visit (HOSPITAL_BASED_OUTPATIENT_CLINIC_OR_DEPARTMENT_OTHER): Payer: Self-pay

## 2022-12-23 DIAGNOSIS — H401131 Primary open-angle glaucoma, bilateral, mild stage: Secondary | ICD-10-CM | POA: Diagnosis not present

## 2022-12-24 ENCOUNTER — Ambulatory Visit: Payer: Medicare PPO

## 2023-01-06 ENCOUNTER — Other Ambulatory Visit (INDEPENDENT_AMBULATORY_CARE_PROVIDER_SITE_OTHER): Payer: Medicare PPO

## 2023-01-06 ENCOUNTER — Other Ambulatory Visit: Payer: Medicare PPO

## 2023-01-06 ENCOUNTER — Other Ambulatory Visit (HOSPITAL_BASED_OUTPATIENT_CLINIC_OR_DEPARTMENT_OTHER): Payer: Self-pay

## 2023-01-06 DIAGNOSIS — D582 Other hemoglobinopathies: Secondary | ICD-10-CM

## 2023-01-06 DIAGNOSIS — D729 Disorder of white blood cells, unspecified: Secondary | ICD-10-CM | POA: Diagnosis not present

## 2023-01-06 LAB — CBC WITH DIFFERENTIAL/PLATELET
Basophils Absolute: 0.1 10*3/uL (ref 0.0–0.1)
Basophils Relative: 1.1 % (ref 0.0–3.0)
Eosinophils Absolute: 0 10*3/uL (ref 0.0–0.7)
Eosinophils Relative: 0.9 % (ref 0.0–5.0)
HCT: 41.9 % (ref 36.0–46.0)
Hemoglobin: 14.1 g/dL (ref 12.0–15.0)
Lymphocytes Relative: 27.1 % (ref 12.0–46.0)
Lymphs Abs: 1.3 10*3/uL (ref 0.7–4.0)
MCHC: 33.7 g/dL (ref 30.0–36.0)
MCV: 95.2 fL (ref 78.0–100.0)
Monocytes Absolute: 0.3 10*3/uL (ref 0.1–1.0)
Monocytes Relative: 6.1 % (ref 3.0–12.0)
Neutro Abs: 3.2 10*3/uL (ref 1.4–7.7)
Neutrophils Relative %: 64.8 % (ref 43.0–77.0)
Platelets: 202 10*3/uL (ref 150.0–400.0)
RBC: 4.4 Mil/uL (ref 3.87–5.11)
RDW: 12.1 % (ref 11.5–15.5)
WBC: 4.9 10*3/uL (ref 4.0–10.5)

## 2023-01-06 LAB — IBC + FERRITIN
Ferritin: 46.9 ng/mL (ref 10.0–291.0)
Iron: 83 ug/dL (ref 42–145)
Saturation Ratios: 28 % (ref 20.0–50.0)
TIBC: 296.8 ug/dL (ref 250.0–450.0)
Transferrin: 212 mg/dL (ref 212.0–360.0)

## 2023-01-06 LAB — VITAMIN B12: Vitamin B-12: 567 pg/mL (ref 211–911)

## 2023-01-06 NOTE — Progress Notes (Signed)
Labs are ok

## 2023-01-07 ENCOUNTER — Encounter: Payer: Self-pay | Admitting: Family Medicine

## 2023-01-10 ENCOUNTER — Other Ambulatory Visit: Payer: Self-pay

## 2023-01-10 ENCOUNTER — Other Ambulatory Visit (HOSPITAL_BASED_OUTPATIENT_CLINIC_OR_DEPARTMENT_OTHER): Payer: Self-pay

## 2023-01-22 ENCOUNTER — Ambulatory Visit
Admission: RE | Admit: 2023-01-22 | Discharge: 2023-01-22 | Disposition: A | Discharge status: Home / Self Care | Payer: Medicare PPO | Source: Ambulatory Visit | Attending: Family Medicine | Admitting: Family Medicine

## 2023-01-22 DIAGNOSIS — Z1231 Encounter for screening mammogram for malignant neoplasm of breast: Secondary | ICD-10-CM | POA: Diagnosis not present

## 2023-01-24 ENCOUNTER — Encounter: Payer: Self-pay | Admitting: Family Medicine

## 2023-02-03 ENCOUNTER — Other Ambulatory Visit (HOSPITAL_BASED_OUTPATIENT_CLINIC_OR_DEPARTMENT_OTHER): Payer: Self-pay

## 2023-02-03 ENCOUNTER — Other Ambulatory Visit: Payer: Self-pay | Admitting: Family Medicine

## 2023-02-03 DIAGNOSIS — F411 Generalized anxiety disorder: Secondary | ICD-10-CM

## 2023-02-03 MED ORDER — CLONAZEPAM 2 MG PO TABS
2.0000 mg | ORAL_TABLET | Freq: Every day | ORAL | 0 refills | Status: DC
  Filled 2023-02-03 – 2023-02-07 (×2): qty 30, 30d supply, fill #0

## 2023-02-06 ENCOUNTER — Encounter: Payer: Self-pay | Admitting: Family Medicine

## 2023-02-07 ENCOUNTER — Other Ambulatory Visit (HOSPITAL_BASED_OUTPATIENT_CLINIC_OR_DEPARTMENT_OTHER): Payer: Self-pay

## 2023-02-07 ENCOUNTER — Encounter: Payer: Self-pay | Admitting: Family Medicine

## 2023-02-08 ENCOUNTER — Other Ambulatory Visit (HOSPITAL_BASED_OUTPATIENT_CLINIC_OR_DEPARTMENT_OTHER): Payer: Self-pay

## 2023-02-10 ENCOUNTER — Other Ambulatory Visit (HOSPITAL_BASED_OUTPATIENT_CLINIC_OR_DEPARTMENT_OTHER): Payer: Self-pay

## 2023-02-10 ENCOUNTER — Other Ambulatory Visit: Payer: Self-pay

## 2023-02-11 ENCOUNTER — Other Ambulatory Visit (HOSPITAL_BASED_OUTPATIENT_CLINIC_OR_DEPARTMENT_OTHER): Payer: Self-pay

## 2023-02-18 ENCOUNTER — Other Ambulatory Visit (HOSPITAL_BASED_OUTPATIENT_CLINIC_OR_DEPARTMENT_OTHER): Payer: Self-pay

## 2023-02-18 MED ORDER — DORZOLAMIDE HCL-TIMOLOL MAL 2-0.5 % OP SOLN
1.0000 [drp] | Freq: Two times a day (BID) | OPHTHALMIC | 3 refills | Status: DC
  Filled 2023-02-18: qty 10, 50d supply, fill #0
  Filled 2023-03-19: qty 10, 50d supply, fill #1
  Filled 2023-04-28: qty 10, 50d supply, fill #2
  Filled 2023-05-26 – 2023-06-10 (×2): qty 10, 50d supply, fill #3

## 2023-02-21 ENCOUNTER — Other Ambulatory Visit (HOSPITAL_BASED_OUTPATIENT_CLINIC_OR_DEPARTMENT_OTHER): Payer: Self-pay

## 2023-02-24 ENCOUNTER — Other Ambulatory Visit (HOSPITAL_BASED_OUTPATIENT_CLINIC_OR_DEPARTMENT_OTHER): Payer: Self-pay

## 2023-02-25 ENCOUNTER — Other Ambulatory Visit (HOSPITAL_BASED_OUTPATIENT_CLINIC_OR_DEPARTMENT_OTHER): Payer: Self-pay

## 2023-03-04 ENCOUNTER — Other Ambulatory Visit (HOSPITAL_BASED_OUTPATIENT_CLINIC_OR_DEPARTMENT_OTHER): Payer: Self-pay

## 2023-03-04 ENCOUNTER — Other Ambulatory Visit: Payer: Self-pay | Admitting: Family Medicine

## 2023-03-04 DIAGNOSIS — F411 Generalized anxiety disorder: Secondary | ICD-10-CM

## 2023-03-04 MED ORDER — CLONAZEPAM 2 MG PO TABS
2.0000 mg | ORAL_TABLET | Freq: Every day | ORAL | 0 refills | Status: DC
  Filled 2023-03-04: qty 90, 90d supply, fill #0
  Filled 2023-03-07: qty 30, 30d supply, fill #0
  Filled 2023-03-07: qty 90, 90d supply, fill #0

## 2023-03-04 MED ORDER — CLONAZEPAM 1 MG PO TABS
1.0000 mg | ORAL_TABLET | Freq: Every day | ORAL | 0 refills | Status: DC
Start: 2023-03-04 — End: 2023-05-26
  Filled 2023-03-04: qty 90, 90d supply, fill #0
  Filled 2023-03-07: qty 30, 30d supply, fill #0
  Filled 2023-03-07: qty 90, 90d supply, fill #0

## 2023-03-04 NOTE — Telephone Encounter (Signed)
Patient stated that she will be out by Saturday and it normally takes the pharmacy a few days to get it ordered, that is the reason she requested it today.

## 2023-03-06 ENCOUNTER — Other Ambulatory Visit (HOSPITAL_BASED_OUTPATIENT_CLINIC_OR_DEPARTMENT_OTHER): Payer: Self-pay

## 2023-03-06 ENCOUNTER — Ambulatory Visit: Payer: Medicare PPO | Admitting: Family Medicine

## 2023-03-06 ENCOUNTER — Encounter: Payer: Self-pay | Admitting: Family Medicine

## 2023-03-06 VITALS — BP 132/72 | HR 73 | Temp 97.4°F | Resp 16 | Ht 63.0 in | Wt 152.0 lb

## 2023-03-06 DIAGNOSIS — F5101 Primary insomnia: Secondary | ICD-10-CM

## 2023-03-06 DIAGNOSIS — I10 Essential (primary) hypertension: Secondary | ICD-10-CM | POA: Diagnosis not present

## 2023-03-06 NOTE — Progress Notes (Signed)
Subjective:    Patient ID: Kristy Hebert, female    DOB: 03-15-1940, 83 y.o.   MRN: [id number]  Chief Complaint  Patient presents with   Medical Management of Chronic Issues    3 month follow-up on htn, insomnia, medication refills Fasting    HPI GAD-  Clonazepam 3mg  at hs. Sleep has been well controlled. States her anxiety has been well managed but she is feeling fatigued a bit during the day, but works thru it.  No SI.   HTN - Pt is on  Felodipine 10 mg daily, Telmisartan 40 mg 2 of them daily as 80mg  caused SE, and Hydralazine 10 mg  PRN if her systolic number is above 160(not needing).    No ha/dizziness/cp/palp/edema/cough/sob.   Discussed the use of AI scribe software for clinical note transcription with the patient, who gave verbal consent to proceed.  History of Present Illness   Kristy Hebert is an 83 year old female with hypertension and anxiety who presents for medication management and follow-up.  She takes felodipine 10 mg daily and telmisartan 40 mg 2/ddaily for blood pressure management. She finds two 40 mg tablets of telmisartan more effective than a single 80 mg tablet, which previously caused a rapid heartbeat. Hydralazine has not been needed recently as her blood pressure readings have been stable, ranging from 114/60 to 149/81, with the highest being 149/81. This morning, her blood pressure was 147/unknown. No chest pain, shortness of breath, leg swelling, or cough. She exercises regularly without issues.  For anxiety and sleep issues, she takes clonazepam 3 mg at bedtime but finds it challenging to wake up and feel stable in the mornings. She feels 'unsteady' during the day and notes that the medication sometimes makes her feel sedated rather than asleep. She has tried taking 2 mg of clonazepam but found it ineffective. Hydroxyzine makes her sleepy and gives her a dry mouth, similar to Benadryl, and does not help her relax. She has taken it occasionally at night  when waking up but finds it ineffective for returning to sleep. No thoughts of suicide.  Exercise, walking, and getting sunshine are beneficial for her anxiety. She has been less active due to cold weather and has experienced some hip discomfort from repetitive walking. She describes herself as a 'real outgoing person' but feels less inclined to socialize while on clonazepam.       There are no preventive care reminders to display for this patient.  Past Medical History:  Diagnosis Date   Anxiety    Complication of anesthesia    Depression    GERD (gastroesophageal reflux disease)    Glaucoma    Hyperglycemia    Hyperlipidemia    Hypertension    Insomnia    Obesity    PONV (postoperative nausea and vomiting)    with ankle surgery   Vitamin D deficiency     Past Surgical History:  Procedure Laterality Date   ANKLE SURGERY Left    CATARACT EXTRACTION, BILATERAL N/A 2021   CHOLECYSTECTOMY     COLONOSCOPY WITH PROPOFOL N/A 07/01/2016   Procedure: COLONOSCOPY WITH PROPOFOL;  Surgeon: Charolett Bumpers, MD;  Location: [facility];  Service: Endoscopy;  Laterality: N/A;     Current Outpatient Medications:    clonazePAM (KLONOPIN) 1 MG tablet, Take 1 tablet (1 mg total) by mouth at bedtime. (Take a 1mg  and 2mg  tablet at bedtime), Disp: 90 tablet, Rfl: 0   clonazePAM (KLONOPIN) 2 MG tablet, Take 1 tablet (  2 mg total) by mouth at bedtime. Take a 2 mg and 1 mg tablet at night for a total 3 mg dose., Disp: 90 tablet, Rfl: 0   dorzolamide-timolol (COSOPT) 2-0.5 % ophthalmic solution, Place 1 drop into both eyes 2 (two) times daily., Disp: 10 mL, Rfl: 3   dorzolamide-timolol (COSOPT) 22.3-6.8 MG/ML ophthalmic solution, Place 1 drop into both eyes 2 (two) times daily., Disp: , Rfl:    felodipine (PLENDIL) 10 MG 24 hr tablet, Take 1 tablet (10 mg total) by mouth daily., Disp: 90 tablet, Rfl: 3   fluticasone (FLONASE) 50 MCG/ACT nasal spray, Place 1 spray into both nostrils daily., Disp: 16  g, Rfl: 11   hydrALAZINE (APRESOLINE) 10 MG tablet, Take 1-2 tablets (10-20 mg total) by mouth 2 (two) times daily. Can take an extra 1-2 tablets as needed for systolic BP > 160, Disp: 150 tablet, Rfl: 3   hydrOXYzine (ATARAX) 10 MG tablet, Take 1 tablet (10 mg total) by mouth 3 (three) times daily as needed., Disp: 60 tablet, Rfl: 2   telmisartan (MICARDIS) 40 MG tablet, Take 2 tablets (80 mg total) by mouth at night., Disp: 180 tablet, Rfl: 3   Travoprost, BAK Free, (TRAVATAN) 0.004 % SOLN ophthalmic solution, Place 1 drop into both eyes every evening., Disp: 10 mL, Rfl: 3  Allergies  Allergen Reactions   Hydralazine     dizziness   Telmisartan     Heart raced on 80mg  tab. Tolerates 2 of the 40mg  tabs.   ROS neg/noncontributory except as noted HPI/below      Objective:     BP 132/72 (BP Location: Left Arm, Patient Position: Sitting, Cuff Size: Normal)   Pulse 73   Temp (!) 97.4 F (36.3 C) (Temporal)   Resp 16   Ht 5\' 3"  (1.6 m)   Wt 152 lb (68.9 kg)   SpO2 99%   BMI 26.93 kg/m  Wt Readings from Last 3 Encounters:  03/06/23 152 lb (68.9 kg)  12/04/22 149 lb 4 oz (67.7 kg)  11/11/22 143 lb (64.9 kg)    Physical Exam   Gen: WDWN NAD HEENT: NCAT, conjunctiva not injected, sclera nonicteric CARDIAC: RRR, S1S2+, no murmur. DP 2+B Lungs: CTAB EXT:  no edema MSK: no gross abnormalities.  NEURO: A&O x3.  CN II-XII intact.  PSYCH: normal mood. Good eye contact     Assessment & Plan:  Primary hypertension Assessment & Plan: Chronic. controlled  Continue felodipine 10 mg daily, telmisartan 40 mg (2 tabs at at bedtime).     Primary insomnia Assessment & Plan: Chronic.  Patient had been taking Xanax for greater than 50 years.  She has tried some other medicines, but either did not work or had side effects.  We discussed the risks.  "I am fine".  Was waking up frequently despite the Xanax, so changed to clonazepam.  She still needs 3 mg for affective sleep.  Discussed that  we need to try other modalities to see if they work. She will continue this for now and know not every night will be perfect .  Her sleep, or lack of, tends to exacerbate her blood pressures.    Assessment and Plan    Hypertension Hypertension is well-controlled with felodipine 10 mg daily and telmisartan 40 mg 2 tabs daily. Home blood pressure readings range from 114/60 to 149/81. No symptoms of chest pain, shortness of breath, leg swelling, or cough. Regular exercise is performed without issues. It is important to continue current medications  and home monitoring. Felodipine and telmisartan effectively reduce cardiovascular risk. Continue felodipine 10 mg daily and telmisartan 40 mg 2/day. Monitor blood pressure at home. Follow up in 4-5 months for a routine check-up.  Anxiety and Insomnia Anxiety and insomnia are present. Clonazepam 3 mg at bedtime helps with anxiety but causes daytime sedation and unsteadiness. Hydroxyzine causes excessive sleepiness and dry mouth. Exercise and outdoor activities help manage anxiety. Clonazepam's sedative effects and potential for tolerance were discussed. Consider alternative medications and possible psychiatrist referral for specialized management(pt wants to hold for now). Continue clonazepam 3 mg at bedtime and discontinue hydroxyzine due to side effects. Consider referral to a psychiatrist for medication management. Encourage regular exercise and outdoor activities.  General Health Maintenance Generally healthy, active, and maintains good lifestyle habits. Does not smoke or drink and follows a healthy diet. At almost 83 years old, remains active and independent. Continuing these habits is important to maintain overall health. Encourage continued healthy lifestyle habits. Schedule a follow-up appointment in 4-5 months.  Follow-up Schedule a follow-up appointment after July 10, 2023. Discuss medication refills and management at the next appointment.        Return in about 4 months (around 07/04/2023) for annual physical-after June 26.   Angelena Sole, MD

## 2023-03-06 NOTE — Assessment & Plan Note (Signed)
Chronic. controlled  Continue felodipine 10 mg daily, telmisartan 40 mg (2 tabs at at bedtime).

## 2023-03-06 NOTE — Assessment & Plan Note (Signed)
Chronic.  Patient had been taking Xanax for greater than 50 years.  She has tried some other medicines, but either did not work or had side effects.  We discussed the risks.  "I am fine".  Was waking up frequently despite the Xanax, so changed to clonazepam.  She still needs 3 mg for affective sleep.  Discussed that we need to try other modalities to see if they work. She will continue this for now and know not every night will be perfect .  Her sleep, or lack of, tends to exacerbate her blood pressures.

## 2023-03-06 NOTE — Patient Instructions (Signed)

## 2023-03-07 ENCOUNTER — Other Ambulatory Visit: Payer: Self-pay

## 2023-03-07 ENCOUNTER — Other Ambulatory Visit (HOSPITAL_BASED_OUTPATIENT_CLINIC_OR_DEPARTMENT_OTHER): Payer: Self-pay

## 2023-03-12 ENCOUNTER — Other Ambulatory Visit: Payer: Self-pay | Admitting: Family Medicine

## 2023-03-13 ENCOUNTER — Other Ambulatory Visit (HOSPITAL_BASED_OUTPATIENT_CLINIC_OR_DEPARTMENT_OTHER): Payer: Self-pay

## 2023-03-13 ENCOUNTER — Encounter (HOSPITAL_BASED_OUTPATIENT_CLINIC_OR_DEPARTMENT_OTHER): Payer: Self-pay

## 2023-03-14 ENCOUNTER — Other Ambulatory Visit (HOSPITAL_BASED_OUTPATIENT_CLINIC_OR_DEPARTMENT_OTHER): Payer: Self-pay

## 2023-03-14 ENCOUNTER — Encounter (HOSPITAL_BASED_OUTPATIENT_CLINIC_OR_DEPARTMENT_OTHER): Payer: Self-pay

## 2023-03-19 ENCOUNTER — Other Ambulatory Visit: Payer: Self-pay

## 2023-03-19 ENCOUNTER — Other Ambulatory Visit (HOSPITAL_BASED_OUTPATIENT_CLINIC_OR_DEPARTMENT_OTHER): Payer: Self-pay

## 2023-03-28 ENCOUNTER — Other Ambulatory Visit: Payer: Self-pay | Admitting: Family Medicine

## 2023-03-28 ENCOUNTER — Telehealth: Payer: Self-pay | Admitting: *Deleted

## 2023-03-28 ENCOUNTER — Other Ambulatory Visit (HOSPITAL_BASED_OUTPATIENT_CLINIC_OR_DEPARTMENT_OTHER): Payer: Self-pay

## 2023-03-28 NOTE — Telephone Encounter (Signed)
 Copied from CRM [id number]. Topic: Clinical - Medication Question >> Mar 28, 2023 12:56 PM Almira Coaster wrote: Reason for CRM: Patient is calling to follow up on the request for hydrOXYzine (ATARAX) 25 MG tablet. States she had spoken with Dr,Kulik to renew her prescription during her 03/06/2023 appointment. States that it helps her sleep at night.   Request has already been sent to pcp, pending approval.

## 2023-03-29 ENCOUNTER — Other Ambulatory Visit (HOSPITAL_BASED_OUTPATIENT_CLINIC_OR_DEPARTMENT_OTHER): Payer: Self-pay

## 2023-04-28 ENCOUNTER — Other Ambulatory Visit (HOSPITAL_BASED_OUTPATIENT_CLINIC_OR_DEPARTMENT_OTHER): Payer: Self-pay

## 2023-04-29 ENCOUNTER — Other Ambulatory Visit (HOSPITAL_BASED_OUTPATIENT_CLINIC_OR_DEPARTMENT_OTHER): Payer: Self-pay

## 2023-05-26 ENCOUNTER — Other Ambulatory Visit (HOSPITAL_BASED_OUTPATIENT_CLINIC_OR_DEPARTMENT_OTHER): Payer: Self-pay

## 2023-05-26 ENCOUNTER — Other Ambulatory Visit: Payer: Self-pay | Admitting: Family Medicine

## 2023-05-26 DIAGNOSIS — F411 Generalized anxiety disorder: Secondary | ICD-10-CM

## 2023-05-26 MED ORDER — CLONAZEPAM 2 MG PO TABS
2.0000 mg | ORAL_TABLET | Freq: Every day | ORAL | 0 refills | Status: DC
  Filled 2023-05-26: qty 90, 90d supply, fill #0

## 2023-05-26 MED ORDER — CLONAZEPAM 1 MG PO TABS
1.0000 mg | ORAL_TABLET | Freq: Every day | ORAL | 0 refills | Status: DC
  Filled 2023-05-26: qty 90, 90d supply, fill #0

## 2023-05-26 NOTE — Telephone Encounter (Signed)
 Copied from CRM [id number]. Topic: Clinical - Medication Refill >> May 26, 2023  9:27 AM Howard Macho wrote: Medication: clonazePAM  (KLONOPIN ) 1 MG tablet (Patient stated she sent a MyChart message about this medication refill)   Has the patient contacted their pharmacy? Yes (Agent: If no, request that the patient contact the pharmacy for the refill. If patient does not wish to contact the pharmacy document the reason why and proceed with request.) (Agent: If yes, when and what did the pharmacy advise?)  This is the patient's preferred pharmacy:  [facility] [address] [place] [address] Phone: [phone] Fax: [phone]  Is this the correct pharmacy for this prescription? Yes If no, delete pharmacy and type the correct one.   Has the prescription been filled recently? Yes  Is the patient out of the medication? No  Has the patient been seen for an appointment in the last year OR does the patient have an upcoming appointment? Yes  Can we respond through MyChart? Yes  Agent: Please be advised that Rx refills may take up to 3 business days. We ask that you follow-up with your pharmacy.

## 2023-05-26 NOTE — Telephone Encounter (Signed)
 Last OV: 03/06/23  Next OV: 07/22/2023  Last Filled: 03/04/23  Quantity: 90 for both 1 and 2 mg

## 2023-05-27 ENCOUNTER — Other Ambulatory Visit (HOSPITAL_BASED_OUTPATIENT_CLINIC_OR_DEPARTMENT_OTHER): Payer: Self-pay

## 2023-05-30 ENCOUNTER — Other Ambulatory Visit (HOSPITAL_BASED_OUTPATIENT_CLINIC_OR_DEPARTMENT_OTHER): Payer: Self-pay

## 2023-06-10 ENCOUNTER — Other Ambulatory Visit (HOSPITAL_BASED_OUTPATIENT_CLINIC_OR_DEPARTMENT_OTHER): Payer: Self-pay

## 2023-06-13 ENCOUNTER — Other Ambulatory Visit: Payer: Self-pay

## 2023-06-13 ENCOUNTER — Other Ambulatory Visit (HOSPITAL_BASED_OUTPATIENT_CLINIC_OR_DEPARTMENT_OTHER): Payer: Self-pay

## 2023-06-27 ENCOUNTER — Other Ambulatory Visit (HOSPITAL_BASED_OUTPATIENT_CLINIC_OR_DEPARTMENT_OTHER): Payer: Self-pay

## 2023-06-27 DIAGNOSIS — H52203 Unspecified astigmatism, bilateral: Secondary | ICD-10-CM | POA: Diagnosis not present

## 2023-06-27 DIAGNOSIS — H524 Presbyopia: Secondary | ICD-10-CM | POA: Diagnosis not present

## 2023-06-27 DIAGNOSIS — H401131 Primary open-angle glaucoma, bilateral, mild stage: Secondary | ICD-10-CM | POA: Diagnosis not present

## 2023-06-27 DIAGNOSIS — H43813 Vitreous degeneration, bilateral: Secondary | ICD-10-CM | POA: Diagnosis not present

## 2023-06-27 DIAGNOSIS — H3500 Unspecified background retinopathy: Secondary | ICD-10-CM | POA: Diagnosis not present

## 2023-06-27 DIAGNOSIS — H04123 Dry eye syndrome of bilateral lacrimal glands: Secondary | ICD-10-CM | POA: Diagnosis not present

## 2023-06-27 MED ORDER — DORZOLAMIDE HCL-TIMOLOL MAL 2-0.5 % OP SOLN
1.0000 [drp] | Freq: Two times a day (BID) | OPHTHALMIC | 4 refills | Status: AC
  Filled 2023-06-27 – 2023-07-11 (×2): qty 20, 90d supply, fill #0
  Filled 2023-07-14: qty 20, 200d supply, fill #0
  Filled 2023-07-15: qty 10, 50d supply, fill #0
  Filled 2023-09-09: qty 10, 50d supply, fill #1
  Filled 2023-10-29: qty 10, 50d supply, fill #2
  Filled 2024-01-12: qty 10, 50d supply, fill #3

## 2023-07-11 ENCOUNTER — Other Ambulatory Visit (HOSPITAL_BASED_OUTPATIENT_CLINIC_OR_DEPARTMENT_OTHER): Payer: Self-pay

## 2023-07-14 ENCOUNTER — Other Ambulatory Visit (HOSPITAL_BASED_OUTPATIENT_CLINIC_OR_DEPARTMENT_OTHER): Payer: Self-pay

## 2023-07-14 ENCOUNTER — Other Ambulatory Visit: Payer: Self-pay

## 2023-07-15 ENCOUNTER — Other Ambulatory Visit: Payer: Self-pay

## 2023-07-15 ENCOUNTER — Other Ambulatory Visit (HOSPITAL_BASED_OUTPATIENT_CLINIC_OR_DEPARTMENT_OTHER): Payer: Self-pay

## 2023-07-22 ENCOUNTER — Ambulatory Visit (INDEPENDENT_AMBULATORY_CARE_PROVIDER_SITE_OTHER): Payer: Medicare PPO | Admitting: Family Medicine

## 2023-07-22 ENCOUNTER — Ambulatory Visit: Payer: Self-pay | Admitting: Family Medicine

## 2023-07-22 ENCOUNTER — Encounter: Payer: Self-pay | Admitting: Family Medicine

## 2023-07-22 ENCOUNTER — Other Ambulatory Visit (HOSPITAL_BASED_OUTPATIENT_CLINIC_OR_DEPARTMENT_OTHER): Payer: Self-pay

## 2023-07-22 VITALS — BP 150/64 | HR 84 | Temp 97.5°F | Resp 16 | Ht 63.0 in | Wt 149.2 lb

## 2023-07-22 DIAGNOSIS — R739 Hyperglycemia, unspecified: Secondary | ICD-10-CM

## 2023-07-22 DIAGNOSIS — E782 Mixed hyperlipidemia: Secondary | ICD-10-CM | POA: Diagnosis not present

## 2023-07-22 DIAGNOSIS — I10 Essential (primary) hypertension: Secondary | ICD-10-CM | POA: Diagnosis not present

## 2023-07-22 DIAGNOSIS — F411 Generalized anxiety disorder: Secondary | ICD-10-CM | POA: Diagnosis not present

## 2023-07-22 LAB — COMPREHENSIVE METABOLIC PANEL WITH GFR
ALT: 14 U/L (ref 0–35)
AST: 20 U/L (ref 0–37)
Albumin: 4.9 g/dL (ref 3.5–5.2)
Alkaline Phosphatase: 53 U/L (ref 39–117)
BUN: 16 mg/dL (ref 6–23)
CO2: 24 meq/L (ref 19–32)
Calcium: 9.5 mg/dL (ref 8.4–10.5)
Chloride: 102 meq/L (ref 96–112)
Creatinine, Ser: 0.78 mg/dL (ref 0.40–1.20)
GFR: 70.48 mL/min (ref >60.00)
Glucose, Bld: 115 mg/dL — ABNORMAL HIGH (ref 70–99)
Potassium: 3.5 meq/L (ref 3.5–5.1)
Sodium: 139 meq/L (ref 135–145)
Total Bilirubin: 0.6 mg/dL (ref 0.2–1.2)
Total Protein: 7.2 g/dL (ref 6.0–8.3)

## 2023-07-22 LAB — CBC WITH DIFFERENTIAL/PLATELET
Basophils Absolute: 0.1 K/uL (ref 0.0–0.1)
Basophils Relative: 1.4 % (ref 0.0–3.0)
Eosinophils Absolute: 0 K/uL (ref 0.0–0.7)
Eosinophils Relative: 0.7 % (ref 0.0–5.0)
HCT: 46 % (ref 36.0–46.0)
Hemoglobin: 15.4 g/dL — ABNORMAL HIGH (ref 12.0–15.0)
Lymphocytes Relative: 19.2 % (ref 12.0–46.0)
Lymphs Abs: 1.4 K/uL (ref 0.7–4.0)
MCHC: 33.4 g/dL (ref 30.0–36.0)
MCV: 93.3 fl (ref 78.0–100.0)
Monocytes Absolute: 0.4 K/uL (ref 0.1–1.0)
Monocytes Relative: 5.2 % (ref 3.0–12.0)
Neutro Abs: 5.2 K/uL (ref 1.4–7.7)
Neutrophils Relative %: 73.5 % (ref 43.0–77.0)
Platelets: 174 K/uL (ref 150.0–400.0)
RBC: 4.93 Mil/uL (ref 3.87–5.11)
RDW: 12.8 % (ref 11.5–15.5)
WBC: 7 K/uL (ref 4.0–10.5)

## 2023-07-22 LAB — LIPID PANEL
Cholesterol: 180 mg/dL (ref 0–200)
HDL: 64.3 mg/dL (ref >39.00)
LDL Cholesterol: 96 mg/dL (ref 0–99)
NonHDL: 115.22
Total CHOL/HDL Ratio: 3
Triglycerides: 98 mg/dL (ref 0.0–149.0)
VLDL: 19.6 mg/dL (ref 0.0–40.0)

## 2023-07-22 LAB — TSH: TSH: 1.98 u[IU]/mL (ref 0.35–5.50)

## 2023-07-22 LAB — HEMOGLOBIN A1C: Hgb A1c MFr Bld: 5.8 % (ref 4.6–6.5)

## 2023-07-22 MED ORDER — CLONAZEPAM 2 MG PO TABS
2.0000 mg | ORAL_TABLET | Freq: Every day | ORAL | 0 refills | Status: DC
  Filled 2023-07-22 – 2023-08-21 (×2): qty 90, 90d supply, fill #0

## 2023-07-22 MED ORDER — ESCITALOPRAM OXALATE 10 MG PO TABS
10.0000 mg | ORAL_TABLET | Freq: Every day | ORAL | 1 refills | Status: DC
  Filled 2023-07-22: qty 30, 30d supply, fill #0

## 2023-07-22 MED ORDER — CLONAZEPAM 1 MG PO TABS
1.0000 mg | ORAL_TABLET | Freq: Every day | ORAL | 0 refills | Status: DC
  Filled 2023-07-22 – 2023-08-21 (×2): qty 90, 90d supply, fill #0

## 2023-07-22 MED ORDER — TELMISARTAN 40 MG PO TABS
80.0000 mg | ORAL_TABLET | Freq: Every evening | ORAL | 3 refills | Status: AC
  Filled 2023-07-22 – 2023-10-21 (×3): qty 180, 90d supply, fill #0
  Filled 2024-01-19: qty 180, 90d supply, fill #1

## 2023-07-22 MED ORDER — FELODIPINE ER 10 MG PO TB24
10.0000 mg | ORAL_TABLET | Freq: Every day | ORAL | 3 refills | Status: AC
  Filled 2023-07-22 – 2023-10-21 (×3): qty 90, 90d supply, fill #0
  Filled 2024-01-12: qty 90, 90d supply, fill #1

## 2023-07-22 NOTE — Progress Notes (Signed)
 Subjective:     Patient ID: Kristy Hebert, female    DOB: [birth year], 83 y.o.   MRN: [id number]  Chief Complaint  Patient presents with   Annual Exam    CPE Not fasting Blood pressure has been elevated, 183/88 this morning, rested for 15 minutes and it was about the same, took 2 hydralazine , 157/77    HPI Discussed the use of AI scribe software for clinical note transcription with the patient, who gave verbal consent to proceed.  History of Present Illness Kristy Hebert is an 83 year old female with hypertension who presents with concerns about medication side effects and elevated blood pressure. Orig sch for CPE, but then spent extra time on concerns  She has been taking clonazepam  for sleep, with a regimen of 2 mg at night. Increasing the dose to 3 mg results in daytime drowsiness, difficulty concentrating, and a persistent 'draggy mood'.  A recent sleep disturbance occurred last pm when she went to bed at 10:25 PM but was awakened by her husband's snoring at 1:30 AM, preventing her from returning to sleep. She moved to another room and only managed 3-4 hours of sleep, leading to a significant increase in her blood pressure to 183/88 mmHg, compared to her usual readings of 120s-130s/70s. She has not been regularly taking her as-needed hydralazine  for blood pressure, as her readings have been stable until this incident. She is currently taking telmisartan  80 mg and felodipine  10 mg daily for hypertension, which have been effective until the recent sleep disturbance.  No SI  She is experiencing social stressors, including her 59 year old daughter who is obese and was recently hospitalized for 16 days. This situation has been emotionally taxing, as she feels a strong need to support her daughter, adding to her stress levels. Did so much better [place] for 2 [place](no stress)  She has been unable to engage in her usual walking exercise due to the heat and some R hip  discomfort, which has limited her physical activity. Exercise typically helps clear her mind and improve her mood.  She experiences headaches at night, which she attributes to watching TV, and manages them with Tylenol . No chest pain or shortness of breath. Her eyes feel more tired and sleepy, which she attributes to the clonazepam , and she uses eye drops for her glaucoma.  H/o HLD and PreDM-no meds, just diet/exercise    There are no preventive care reminders to display for this patient.  Past Medical History:  Diagnosis Date   Anxiety    Complication of anesthesia    Depression    GERD (gastroesophageal reflux disease)    Glaucoma    Hyperglycemia    Hyperlipidemia    Hypertension    Insomnia    Obesity    PONV (postoperative nausea and vomiting)    with ankle surgery   Vitamin D deficiency     Past Surgical History:  Procedure Laterality Date   ANKLE SURGERY Left    CATARACT EXTRACTION, BILATERAL N/A 2021   CHOLECYSTECTOMY     COLONOSCOPY WITH PROPOFOL  N/A 07/01/2016   Procedure: COLONOSCOPY WITH PROPOFOL ;  Surgeon: Vicci Gladis POUR, MD;  Location: [facility];  Service: Endoscopy;  Laterality: N/A;   EYE SURGERY       Current Outpatient Medications:    dorzolamide -timolol  (COSOPT ) 2-0.5 % ophthalmic solution, Place 1 drop into both eyes 2 (two) times daily., Disp: 20 mL, Rfl: 4   dorzolamide -timolol  (COSOPT ) 22.3-6.8 MG/ML ophthalmic  solution, Place 1 drop into both eyes 2 (two) times daily., Disp: , Rfl:    escitalopram  (LEXAPRO ) 10 MG tablet, Take 1 tablet (10 mg total) by mouth daily., Disp: 30 tablet, Rfl: 1   fluticasone  (FLONASE ) 50 MCG/ACT nasal spray, Place 1 spray into both nostrils daily., Disp: 16 g, Rfl: 11   hydrALAZINE  (APRESOLINE ) 10 MG tablet, Take 1-2 tablets (10-20 mg total) by mouth 2 (two) times daily. Can take an extra 1-2 tablets as needed for systolic BP > 160, Disp: 150 tablet, Rfl: 3   Travoprost , BAK Free, (TRAVATAN ) 0.004 % SOLN ophthalmic  solution, Place 1 drop into both eyes every evening., Disp: 10 mL, Rfl: 3   clonazePAM  (KLONOPIN ) 1 MG tablet, Take 1 tablet (1 mg total) by mouth at bedtime. (Take with your 2mg  tablet at bedtime for a total of 3mg ., Disp: 90 tablet, Rfl: 0   clonazePAM  (KLONOPIN ) 2 MG tablet, Take 1 tablet (2 mg total) by mouth at bedtime. Take with your 1 mg tablet at night for a total of 3 mg., Disp: 90 tablet, Rfl: 0   felodipine  (PLENDIL ) 10 MG 24 hr tablet, Take 1 tablet (10 mg total) by mouth daily., Disp: 90 tablet, Rfl: 3   telmisartan  (MICARDIS ) 40 MG tablet, Take 2 tablets (80 mg total) by mouth at night., Disp: 180 tablet, Rfl: 3  Allergies  Allergen Reactions   Hydralazine      dizziness   Telmisartan      Heart raced on 80mg  tab. Tolerates 2 of the 40mg  tabs.   ROS neg/noncontributory except as noted HPI/below      Objective:     BP (!) 150/64 (BP Location: Left Arm, Patient Position: Sitting, Cuff Size: Normal)   Pulse 84   Temp (!) 97.5 F (36.4 C) (Temporal)   Resp 16   Ht 5' 3 (1.6 m)   Wt 149 lb 4 oz (67.7 kg)   SpO2 98%   BMI 26.44 kg/m  Wt Readings from Last 3 Encounters:  07/22/23 149 lb 4 oz (67.7 kg)  03/06/23 152 lb (68.9 kg)  12/04/22 149 lb 4 oz (67.7 kg)    Physical Exam   Gen: WDWN NAD HEENT: NCAT, conjunctiva not injected, sclera nonicteric NECK:  supple, no thyromegaly, no nodes, no carotid bruits CARDIAC: RRR, S1S2+, no murmur. DP 2+B LUNGS: CTAB. No wheezes ABDOMEN:  BS+, soft, NTND, No HSM, no masses EXT:  no edema MSK: no gross abnormalities.  NEURO: A&O x3.  CN II-XII intact.  PSYCH: normal mood. Good eye contact  Spent 41 min w/pt listening to problems, discussing meds, offering support, etc.     Assessment & Plan:  Primary hypertension -     CBC with Differential/Platelet -     Comprehensive metabolic panel with GFR -     Lipid panel -     TSH -     Felodipine  ER; Take 1 tablet (10 mg total) by mouth daily.  Dispense: 90 tablet; Refill:  3 -     Telmisartan ; Take 2 tablets (80 mg total) by mouth at night.  Dispense: 180 tablet; Refill: 3  Generalized anxiety disorder -     TSH -     Escitalopram  Oxalate; Take 1 tablet (10 mg total) by mouth daily.  Dispense: 30 tablet; Refill: 1 -     clonazePAM ; Take 1 tablet (1 mg total) by mouth at bedtime. (Take with your 2mg  tablet at bedtime for a total of 3mg .  Dispense: 90 tablet; Refill:  0 -     clonazePAM ; Take 1 tablet (2 mg total) by mouth at bedtime. Take with your 1 mg tablet at night for a total of 3 mg.  Dispense: 90 tablet; Refill: 0  Mixed hyperlipidemia -     Comprehensive metabolic panel with GFR -     Lipid panel -     TSH  Hyperglycemia -     Comprehensive metabolic panel with GFR -     Hemoglobin A1c  Assessment and Plan Assessment & Plan Insomnia   She experiences chronic and situational insomnia, primarily due to her husband's snoring, which leads to stress and elevated blood pressure. Clonazepam  helps her sleep but causes daytime drowsiness and affects concentration. If insomnia persists, a switch to temazepam may be considered, though it may not significantly impact blood pressure control. Continue the clonazepam  regimen, discuss the potential switch to temazepam if needed, and monitor sleep patterns, reporting any changes. She will be traveling a lot and doesn't want to change meds yet. Very aware of risks of benzos in elderly.  Also advised meds don't always work and there will continue to be issues w/insomnia despite meds.   Hypertension   Her hypertension is well-controlled with telmisartan  and felodipine , but blood pressure rose to 183/88 mmHg following poor sleep and stress. Lack of sleep and stress can acutely raise blood pressure, and usual medications may not be effective in such situations. She has not been using hydralazine  for acute elevations as hasn't needed to until this am.  Continue telmisartan  80 mg and felodipine  10 mg, monitor blood pressure  regularly, refill medications, and consider hydralazine  for acute elevations if necessary.  Anxiety and Depression   She experiences stress and anxiety related to her daughter's health and personal responsibilities. Starting escitalopram  is considered to manage symptoms, with benefits discussed. Begin escitalopram  10 mg, taking half a tablet for one week, then increasing to a full tablet. Follow up in one month to assess effectiveness and adjust dosage if necessary.  Glaucoma   She manages glaucoma with eye drops and reports dry, tired eyes, possibly exacerbated by clonazepam . Continue glaucoma management with eye drops and use warm moisture and additional eye drops to alleviate dryness.  General Health Maintenance   She is generally active, engaging in walking for exercise, though recent heat and hip discomfort have limited activity. She is planning a vacation and is mindful of maintaining health. Encourage regular physical activity as tolerated and consider using a portable fan for outdoor exercise. Plan medication refills for the vacation.  Follow-up   She is planning a vacation and needs to manage medications. Schedule a follow-up in one month post-vacation to reassess her medication regimen and overall health. Ensure medication refills before the vacation and consider blood work at the next visit if not done today.    Return in about 4 weeks (around 08/19/2023) for HTN, mood.  Jenkins CHRISTELLA Carrel, MD

## 2023-07-22 NOTE — Patient Instructions (Signed)
 It was very nice to see you today!  Start escitalopram  1/2 tab for 1 week then the whole tab   PLEASE NOTE:  If you had any lab tests please let us  know if you have not heard back within a few days. You may see your results on MyChart before we have a chance to review them but we will give you a call once they are reviewed by us . If we ordered any referrals today, please let us  know if you have not heard from their office within the next week.   Please try these tips to maintain a healthy lifestyle:  Eat most of your calories during the day when you are active. Eliminate processed foods including packaged sweets (pies, cakes, cookies), reduce intake of potatoes, white bread, white pasta, and white rice. Look for whole grain options, oat flour or almond flour.  Each meal should contain half fruits/vegetables, one quarter protein, and one quarter carbs (no bigger than a computer mouse).  Cut down on sweet beverages. This includes juice, soda, and sweet tea. Also watch fruit intake, though this is a healthier sweet option, it still contains natural sugar! Limit to 3 servings daily.  Drink at least 1 glass of water with each meal and aim for at least 8 glasses per day  Exercise at least 150 minutes every week.

## 2023-07-22 NOTE — Progress Notes (Signed)
 Labs look great except  A1C(3 month average of sugars) is elevated.  This may be considered PreDiabetes.  Work on diet-decrease sugars and starches and aim for 30 minutes of exercise 5 days/week to prevent progression to diabetes  may just be part of aging

## 2023-08-11 ENCOUNTER — Other Ambulatory Visit: Payer: Self-pay

## 2023-08-11 ENCOUNTER — Ambulatory Visit: Payer: Self-pay

## 2023-08-11 NOTE — Telephone Encounter (Signed)
 FYI Only or Action Required?: Action required by provider: clinical question for provider and update on patient condition.  Patient was last seen in primary care on 07/22/2023 by Wendolyn Jenkins Jansky, MD.  Called Nurse Triage reporting Hypertension.  Symptoms began several days ago.  Interventions attempted: Prescription medications: hydrazaline.  Symptoms are: variable  Triage Disposition: See PCP When Office is Open (Within 3 Days)  Patient/caregiver understands and will follow disposition?: Yes  Message from [place] M sent at 08/11/2023  9:02 AM EDT  added on Lexapro  meds last week- bp flare up to 174/85 and heart rate dropped to 49. Wanting to know if she should stay on the new medications.   Reason for Disposition  [1] Taking BP medications AND [2] feels is having side effects (e.g., impotence, cough, dizzy upon standing)  Answer Assessment - Initial Assessment Questions Patient wants to stop taking Lexapro .  Plans to reduce dosage to 5 mg as she had no symptoms of HTN at this dose.   Encouraged to talk to Dr. Wendolyn before making any changes to medications    1. BLOOD PRESSURE: What is your blood pressure? Did you take at least two measurements 5 minutes apart?     Yesterday, BP running high at 170's/80's HR 49 Regularly runs pulse 60's  This AM 176/89 Patient has had all medications in addition to PRN hydrazaline, after, BP is 146/68 HR 69   2. ONSET: When did you take your blood pressure?     Patient states that she was feeling bad (head ache) last night, so she took her BP and found it to be elevated.   3. HOW: How did you take your blood pressure? (e.g., automatic home BP monitor, visiting nurse)     Automatic cuff  4. HISTORY: Do you have a history of high blood pressure?     yes 5. MEDICINES: Are you taking any medicines for blood pressure? Have you missed any doses recently?     Patient has recently added lexapro  and is concerned that it may be  affecting her blood pressure  6. OTHER SYMPTOMS: Do you have any symptoms? (e.g., blurred vision, chest pain, difficulty breathing, headache, weakness)     Patient experienced head ache last night which prompted her to take her BP.  She also states that she felt her heart racing last night and it kept her from sleep.  Today, she was asymptomatic when she woke up  Protocols used: Blood Pressure - High-A-AH

## 2023-08-11 NOTE — Telephone Encounter (Signed)
 Patient notified of message below and verbalized understanding. Patient stated that her blood pressure did go down.

## 2023-08-14 ENCOUNTER — Ambulatory Visit (INDEPENDENT_AMBULATORY_CARE_PROVIDER_SITE_OTHER): Admitting: Family Medicine

## 2023-08-14 ENCOUNTER — Other Ambulatory Visit (HOSPITAL_BASED_OUTPATIENT_CLINIC_OR_DEPARTMENT_OTHER): Payer: Self-pay

## 2023-08-14 ENCOUNTER — Telehealth: Payer: Self-pay | Admitting: *Deleted

## 2023-08-14 ENCOUNTER — Encounter: Payer: Self-pay | Admitting: Family Medicine

## 2023-08-14 VITALS — BP 140/68 | HR 74 | Temp 97.4°F | Resp 16 | Ht 63.0 in | Wt 150.1 lb

## 2023-08-14 DIAGNOSIS — F411 Generalized anxiety disorder: Secondary | ICD-10-CM

## 2023-08-14 DIAGNOSIS — I10 Essential (primary) hypertension: Secondary | ICD-10-CM

## 2023-08-14 DIAGNOSIS — R739 Hyperglycemia, unspecified: Secondary | ICD-10-CM

## 2023-08-14 MED ORDER — ESCITALOPRAM OXALATE 5 MG PO TABS
5.0000 mg | ORAL_TABLET | Freq: Every day | ORAL | 0 refills | Status: DC
Start: 2023-08-14 — End: 2023-09-03
  Filled 2023-08-14: qty 30, 30d supply, fill #0

## 2023-08-14 NOTE — Telephone Encounter (Signed)
 Copied from CRM [id number]. Topic: Clinical - Medication Question >> Aug 14, 2023 11:16 AM Chasity T wrote: Reason for CRM: Patient is calling in to see if Dr Wendolyn can give permission to the pharmacy to refill the prescription clonazePAM  (KLONOPIN ) 1 MG tablet and clonazePAM  (KLONOPIN ) 2 MG tablet on Aug 7th before she goes on vacation on Aug 9th.

## 2023-08-14 NOTE — Patient Instructions (Addendum)
 It was very nice to see you today!  Medical power of attorney  Do the 5mg  lexapro   Check blood pressure 2x/month as regular-vary the times, then check extra if feel poorly    PLEASE NOTE:  If you had any lab tests please let us  know if you have not heard back within a few days. You may see your results on MyChart before we have a chance to review them but we will give you a call once they are reviewed by us . If we ordered any referrals today, please let us  know if you have not heard from their office within the next week.   Please try these tips to maintain a healthy lifestyle:  Eat most of your calories during the day when you are active. Eliminate processed foods including packaged sweets (pies, cakes, cookies), reduce intake of potatoes, white bread, white pasta, and white rice. Look for whole grain options, oat flour or almond flour.  Each meal should contain half fruits/vegetables, one quarter protein, and one quarter carbs (no bigger than a computer mouse).  Cut down on sweet beverages. This includes juice, soda, and sweet tea. Also watch fruit intake, though this is a healthier sweet option, it still contains natural sugar! Limit to 3 servings daily.  Drink at least 1 glass of water with each meal and aim for at least 8 glasses per day  Exercise at least 150 minutes every week.

## 2023-08-14 NOTE — Telephone Encounter (Signed)
 Pharmacy notified and verbalized understanding.

## 2023-08-14 NOTE — Progress Notes (Signed)
 Subjective:     Patient ID: Kristy Hebert, female    DOB: August 22, 1940, 83 y.o.   MRN: [id number]  Chief Complaint  Patient presents with   Medical Management of Chronic Issues    Follow-up on htn and mood Not fasting     HPI Discussed the use of AI scribe software for clinical note transcription with the patient, who gave verbal consent to proceed.  History of Present Illness Kristy Hebert is an 83 year old female with hypertension who presents with concerns about blood pressure management.  After her last visit, she went on a two-week vacation during which she did not monitor her blood pressure but felt well. Upon returning, she resumed checking her blood pressure and noted elevated readings, particularly on July 25th and 26th, with values reaching 179/85 mmHg. She experienced dizziness and took hydralazine  20 mg to manage her blood pressure, but it remained elevated throughout the day.  She describes her heart rate as being lower than usual, around 49-50 bpm, during the episodes of elevated blood pressure. Her usual heart rate is around 60 bpm. By the next morning, her blood pressure decreased to 146/68 mmHg, and her heart rate increased to 69 bpm.  She has been taking clonazepam  3 mg at night, which she feels helps manage her blood pressure and anxiety. She has experimented with taking 1.5 mg but feels that the full 3 mg dose is more effective. She also stopped Lexapro  after receiving a message about potential blood pressure effects, although she felt better while on it.  She maintains a physically active lifestyle, walking regularly and performing exercises on her deck. She is mindful of her diet, avoiding sweets and sugary foods, and primarily drinks water. She is concerned about her A1c level, which was 5.8.  She discusses stress related to her daughter, which she feels impacts her health. She is trying to distance herself to reduce stress, as she believes it affects her  blood pressure.    There are no preventive care reminders to display for this patient.  Past Medical History:  Diagnosis Date   Anxiety    Complication of anesthesia    Depression    GERD (gastroesophageal reflux disease)    Glaucoma    Hyperglycemia    Hyperlipidemia    Hypertension    Insomnia    Obesity    PONV (postoperative nausea and vomiting)    with ankle surgery   Vitamin D deficiency     Past Surgical History:  Procedure Laterality Date   ANKLE SURGERY Left    CATARACT EXTRACTION, BILATERAL N/A 2021   CHOLECYSTECTOMY     COLONOSCOPY WITH PROPOFOL  N/A 07/01/2016   Procedure: COLONOSCOPY WITH PROPOFOL ;  Surgeon: Vicci Gladis POUR, MD;  Location: [facility];  Service: Endoscopy;  Laterality: N/A;   EYE SURGERY       Current Outpatient Medications:    clonazePAM  (KLONOPIN ) 1 MG tablet, Take 1 tablet (1 mg total) by mouth at bedtime. (Take with your 2mg  tablet at bedtime for a total of 3mg ., Disp: 90 tablet, Rfl: 0   clonazePAM  (KLONOPIN ) 2 MG tablet, Take 1 tablet (2 mg total) by mouth at bedtime. Take with your 1 mg tablet at night for a total of 3 mg., Disp: 90 tablet, Rfl: 0   dorzolamide -timolol  (COSOPT ) 2-0.5 % ophthalmic solution, Place 1 drop into both eyes 2 (two) times daily., Disp: 20 mL, Rfl: 4   dorzolamide -timolol  (COSOPT ) 22.3-6.8 MG/ML ophthalmic solution, Place 1 drop  into both eyes 2 (two) times daily., Disp: , Rfl:    felodipine  (PLENDIL ) 10 MG 24 hr tablet, Take 1 tablet (10 mg total) by mouth daily., Disp: 90 tablet, Rfl: 3   fluticasone  (FLONASE ) 50 MCG/ACT nasal spray, Place 1 spray into both nostrils daily., Disp: 16 g, Rfl: 11   hydrALAZINE  (APRESOLINE ) 10 MG tablet, Take 1-2 tablets (10-20 mg total) by mouth 2 (two) times daily. Can take an extra 1-2 tablets as needed for systolic BP > 160, Disp: 150 tablet, Rfl: 3   telmisartan  (MICARDIS ) 40 MG tablet, Take 2 tablets (80 mg total) by mouth at night., Disp: 180 tablet, Rfl: 3   Travoprost ,  BAK Free, (TRAVATAN ) 0.004 % SOLN ophthalmic solution, Place 1 drop into both eyes every evening., Disp: 10 mL, Rfl: 3   escitalopram  (LEXAPRO ) 5 MG tablet, Take 1 tablet (5 mg total) by mouth daily., Disp: 30 tablet, Rfl: 0  Allergies  Allergen Reactions   Hydralazine      dizziness   Telmisartan      Heart raced on 80mg  tab. Tolerates 2 of the 40mg  tabs.   ROS neg/noncontributory except as noted HPI/below      Objective:     BP (!) 140/68 (BP Location: Left Arm, Cuff Size: Normal)   Pulse 74   Temp (!) 97.4 F (36.3 C) (Temporal)   Resp 16   Ht 5' 3 (1.6 m)   Wt 150 lb 2 oz (68.1 kg)   SpO2 95%   BMI 26.59 kg/m  Wt Readings from Last 3 Encounters:  08/14/23 150 lb 2 oz (68.1 kg)  07/22/23 149 lb 4 oz (67.7 kg)  03/06/23 152 lb (68.9 kg)    Physical Exam   Gen: WDWN NAD HEENT: NCAT, conjunctiva not injected, sclera nonicteric CARDIAC: RRR, S1S2+, no murmur.  EXT:  no edema MSK: no gross abnormalities.  NEURO: A&O x3.  CN II-XII intact.  PSYCH: normal mood. Good eye contact     Assessment & Plan:  Primary hypertension  Hyperglycemia  Generalized anxiety disorder -     Escitalopram  Oxalate; Take 1 tablet (5 mg total) by mouth daily.  Dispense: 30 tablet; Refill: 0  Assessment and Plan Assessment & Plan Adult Wellness Visit   Discussed the importance of having a healthcare power of attorney. She was encouraged to download forms from the internet to outline her wishes and consider having them notarized.  Hypertension   She reports episodes of elevated blood pressure, with readings up to 179/85, accompanied by dizziness and elevated heart rate. Blood pressure improves with rest and medication. Advised against frequent monitoring to avoid anxiety. She should check her blood pressure twice a month at varying times and additionally if feeling unwell. Continue current antihypertensive medications.  Anxiety and Depression   Previously started on Lexapro  with  improvement but stopped due to blood pressure concerns. Currently, she is not experiencing significant anxiety or depression symptoms. Discussed restarting Lexapro  at a lower dose of 5 mg daily to assess tolerance and effectiveness.  Prediabetes   Her A1c is 5.8, indicating prediabetes. Discussed dietary habits and reassured that her current diet is appropriate. Encouraged to maintain healthy eating habits and monitor sugar intake.    Return for as sch 8/20.  Jenkins CHRISTELLA Carrel, MD

## 2023-08-15 ENCOUNTER — Other Ambulatory Visit (HOSPITAL_BASED_OUTPATIENT_CLINIC_OR_DEPARTMENT_OTHER): Payer: Self-pay

## 2023-08-19 ENCOUNTER — Other Ambulatory Visit (HOSPITAL_BASED_OUTPATIENT_CLINIC_OR_DEPARTMENT_OTHER): Payer: Self-pay

## 2023-08-20 ENCOUNTER — Ambulatory Visit: Admitting: Family Medicine

## 2023-08-21 ENCOUNTER — Other Ambulatory Visit: Payer: Self-pay

## 2023-08-21 ENCOUNTER — Other Ambulatory Visit (HOSPITAL_BASED_OUTPATIENT_CLINIC_OR_DEPARTMENT_OTHER): Payer: Self-pay

## 2023-08-26 ENCOUNTER — Other Ambulatory Visit (HOSPITAL_BASED_OUTPATIENT_CLINIC_OR_DEPARTMENT_OTHER): Payer: Self-pay

## 2023-09-03 ENCOUNTER — Other Ambulatory Visit: Payer: Self-pay

## 2023-09-03 ENCOUNTER — Ambulatory Visit (INDEPENDENT_AMBULATORY_CARE_PROVIDER_SITE_OTHER): Admitting: Family Medicine

## 2023-09-03 ENCOUNTER — Other Ambulatory Visit (HOSPITAL_BASED_OUTPATIENT_CLINIC_OR_DEPARTMENT_OTHER): Payer: Self-pay

## 2023-09-03 ENCOUNTER — Encounter: Payer: Self-pay | Admitting: Family Medicine

## 2023-09-03 VITALS — BP 138/80 | HR 67 | Temp 97.5°F | Resp 16 | Ht 63.0 in | Wt 152.5 lb

## 2023-09-03 DIAGNOSIS — F411 Generalized anxiety disorder: Secondary | ICD-10-CM | POA: Diagnosis not present

## 2023-09-03 DIAGNOSIS — I10 Essential (primary) hypertension: Secondary | ICD-10-CM | POA: Diagnosis not present

## 2023-09-03 MED ORDER — BUSPIRONE HCL 5 MG PO TABS
5.0000 mg | ORAL_TABLET | Freq: Two times a day (BID) | ORAL | 1 refills | Status: AC
Start: 2023-09-03 — End: ?
  Filled 2023-09-03: qty 60, 30d supply, fill #0
  Filled 2023-09-29: qty 60, 30d supply, fill #1

## 2023-09-03 MED ORDER — ESCITALOPRAM OXALATE 5 MG PO TABS
5.0000 mg | ORAL_TABLET | Freq: Every day | ORAL | 0 refills | Status: DC
  Filled 2023-09-03 – 2023-09-09 (×2): qty 90, 90d supply, fill #0

## 2023-09-03 NOTE — Progress Notes (Signed)
 Subjective:     Patient ID: Kristy Hebert, female    DOB: 08/07/1940, 83 y.o.   MRN: [id number]  Chief Complaint  Patient presents with   Medical Management of Chronic Issues    4 week follow-up on htn, mood Has blood pressure readings Not fasting     HPI Discussed the use of AI scribe software for clinical note transcription with the patient, who gave verbal consent to proceed.  History of Present Illness Kristy Hebert is an 83 year old female with hypertension and anxiety who presents for medication management and follow-up.  [place] in walking and relaxation activities. During this time, she monitored her blood pressure twice, recording readings of 144/76 mmHg and 144/74 mmHg.  She is currently taking telmisartan  80 mg and felodipine  10 mg daily for blood pressure management. Hydralazine  is available but has not been needed. She also takes clonazepam  3 mg, which helps her sleep and manage anxiety but causes grogginess and affects her balance some the next day. She feels off balance and lacks energy, impacting her daily activities.  She has been avoiding situations and people that increase her anxiety, maintaining a positive attitude through activities like Bible reading. She sleeps approximately six hours per night, which she considers adequate, but notes feeling sleepy in the mornings.  Her family history includes her mother having severe anxiety and strokes, and her sister having low blood pressure. Her sister's blood pressure remains in the 120s, while she is content with her own readings in the 140s.  No chest pain or shortness of breath. She reports feeling groggy and off balance due to clonazepam  use.    Health Maintenance Due  Topic Date Due   INFLUENZA VACCINE  08/15/2023    Past Medical History:  Diagnosis Date   Anxiety    Complication of anesthesia    Depression    GERD (gastroesophageal reflux  disease)    Glaucoma    Hyperglycemia    Hyperlipidemia    Hypertension    Insomnia    Obesity    PONV (postoperative nausea and vomiting)    with ankle surgery   Vitamin D deficiency     Past Surgical History:  Procedure Laterality Date   ANKLE SURGERY Left    CATARACT EXTRACTION, BILATERAL N/A 2021   CHOLECYSTECTOMY     COLONOSCOPY WITH PROPOFOL  N/A 07/01/2016   Procedure: COLONOSCOPY WITH PROPOFOL ;  Surgeon: Vicci Gladis POUR, MD;  Location: [facility];  Service: Endoscopy;  Laterality: N/A;   EYE SURGERY       Current Outpatient Medications:    busPIRone  (BUSPAR ) 5 MG tablet, Take 1 tablet (5 mg total) by mouth 2 (two) times daily., Disp: 60 tablet, Rfl: 1   clonazePAM  (KLONOPIN ) 1 MG tablet, Take 1 tablet (1 mg total) by mouth at bedtime. (Take with your 2mg  tablet at bedtime for a total of 3mg ., Disp: 90 tablet, Rfl: 0   clonazePAM  (KLONOPIN ) 2 MG tablet, Take 1 tablet (2 mg total) by mouth at bedtime. Take with your 1 mg tablet at night for a total of 3 mg., Disp: 90 tablet, Rfl: 0   dorzolamide -timolol  (COSOPT ) 2-0.5 % ophthalmic solution, Place 1 drop into both eyes 2 (two) times daily., Disp: 20 mL, Rfl: 4   dorzolamide -timolol  (COSOPT ) 22.3-6.8 MG/ML ophthalmic solution, Place 1 drop into both eyes 2 (two) times daily., Disp: , Rfl:    felodipine  (PLENDIL ) 10 MG 24 hr  tablet, Take 1 tablet (10 mg total) by mouth daily., Disp: 90 tablet, Rfl: 3   fluticasone  (FLONASE ) 50 MCG/ACT nasal spray, Place 1 spray into both nostrils daily., Disp: 16 g, Rfl: 11   hydrALAZINE  (APRESOLINE ) 10 MG tablet, Take 1-2 tablets (10-20 mg total) by mouth 2 (two) times daily. Can take an extra 1-2 tablets as needed for systolic BP > 160, Disp: 150 tablet, Rfl: 3   telmisartan  (MICARDIS ) 40 MG tablet, Take 2 tablets (80 mg total) by mouth at night., Disp: 180 tablet, Rfl: 3   Travoprost , BAK Free, (TRAVATAN ) 0.004 % SOLN ophthalmic solution, Place 1 drop into both eyes every evening., Disp: 10  mL, Rfl: 3   escitalopram  (LEXAPRO ) 5 MG tablet, Take 1 tablet (5 mg total) by mouth daily., Disp: 90 tablet, Rfl: 0  Allergies  Allergen Reactions   Hydralazine      dizziness   Telmisartan      Heart raced on 80mg  tab. Tolerates 2 of the 40mg  tabs.   ROS neg/noncontributory except as noted HPI/below      Objective:     BP 138/80 (BP Location: Left Arm, Patient Position: Sitting, Cuff Size: Normal)   Pulse 67   Temp (!) 97.5 F (36.4 C) (Temporal)   Resp 16   Ht 5' 3 (1.6 m)   Wt 152 lb 8 oz (69.2 kg)   SpO2 97%   BMI 27.01 kg/m  Wt Readings from Last 3 Encounters:  09/03/23 152 lb 8 oz (69.2 kg)  08/14/23 150 lb 2 oz (68.1 kg)  07/22/23 149 lb 4 oz (67.7 kg)    Physical Exam   Gen: WDWN NAD HEENT: NCAT, conjunctiva not injected, sclera nonicteric NECK:  supple, no thyromegaly, no nodes, no carotid bruits CARDIAC: RRR, S1S2+, no murmur. DP 2+B LUNGS: CTAB. No wheezes EXT:  no edema MSK: no gross abnormalities.  NEURO: A&O x3.  CN II-XII intact.  PSYCH: normal mood. Good eye contact     Assessment & Plan:  Primary hypertension  Generalized anxiety disorder -     Escitalopram  Oxalate; Take 1 tablet (5 mg total) by mouth daily.  Dispense: 90 tablet; Refill: 0 -     busPIRone  HCl; Take 1 tablet (5 mg total) by mouth 2 (two) times daily.  Dispense: 60 tablet; Refill: 1  Assessment and Plan Assessment & Plan Hypertension   Blood pressure readings at home were 144/76 and 144/74, consistent with previous readings. It is currently managed with telmisartan  80 mg and felodipine  10 mg daily. Clonazepam  is used to manage anxiety, which indirectly helps control blood pressure. She is satisfied with blood pressure in the 140s. Continue telmisartan  80 mg daily, felodipine  10 mg daily, and clonazepam  as it helps with anxiety and indirectly with blood pressure control. Normal for first time on my recheck here!  Generalized anxiety disorder   Anxiety is managed with clonazepam   3 mg, which causes grogginess and affects daily activities some. Lexapro  5 mg is being used, with consideration to increase the dose. Discussed switching clonazepam  to temazepam or adding buspirone  to manage daytime anxiety without causing grogginess. Decided to start buspirone  5 mg in the morning for 3-5 days, then increase to twice a day if needed. Consider reducing clonazepam  to 2 mg at night if buspirone  is effective. Continue Lexapro  5 mg daily and provide a refill. Monitor for effectiveness and side effects of buspirone .  Insomnia   Currently managed with clonazepam , which helps with sleep but causes daytime grogginess. Discussed switching to  temazepam for better sleep management without daytime grogginess, but decided to focus on managing anxiety first. Continue current management with clonazepam  for sleep.    Return in about 4 weeks (around 10/01/2023) for mood.  Kristy CHRISTELLA Carrel, MD

## 2023-09-03 NOTE — Patient Instructions (Signed)
 Stay on lexapro  5mg  Start buspar  5mg  in the am for 3-5 days then twice daily if needed.   If doing well in 1-2 weeks, try decreasing the clonazepam  to 2mg .

## 2023-09-09 ENCOUNTER — Other Ambulatory Visit (HOSPITAL_BASED_OUTPATIENT_CLINIC_OR_DEPARTMENT_OTHER): Payer: Self-pay

## 2023-09-18 ENCOUNTER — Encounter: Payer: Self-pay | Admitting: Family Medicine

## 2023-09-29 ENCOUNTER — Other Ambulatory Visit (HOSPITAL_BASED_OUTPATIENT_CLINIC_OR_DEPARTMENT_OTHER): Payer: Self-pay

## 2023-09-29 ENCOUNTER — Other Ambulatory Visit: Payer: Self-pay | Admitting: Family Medicine

## 2023-09-29 DIAGNOSIS — F411 Generalized anxiety disorder: Secondary | ICD-10-CM

## 2023-09-29 MED ORDER — ESCITALOPRAM OXALATE 10 MG PO TABS
10.0000 mg | ORAL_TABLET | Freq: Every day | ORAL | 1 refills | Status: DC
Start: 2023-09-29 — End: 2023-10-03
  Filled 2023-09-29: qty 30, 30d supply, fill #0

## 2023-09-29 MED ORDER — FLUZONE HIGH-DOSE 0.5 ML IM SUSY
0.5000 mL | PREFILLED_SYRINGE | Freq: Once | INTRAMUSCULAR | 0 refills | Status: AC
  Filled 2023-09-29: qty 0.5, 1d supply, fill #0

## 2023-10-03 ENCOUNTER — Encounter: Payer: Self-pay | Admitting: Family Medicine

## 2023-10-03 ENCOUNTER — Ambulatory Visit (INDEPENDENT_AMBULATORY_CARE_PROVIDER_SITE_OTHER): Admitting: Family Medicine

## 2023-10-03 ENCOUNTER — Other Ambulatory Visit (HOSPITAL_BASED_OUTPATIENT_CLINIC_OR_DEPARTMENT_OTHER): Payer: Self-pay

## 2023-10-03 VITALS — BP 130/58 | HR 63 | Temp 98.2°F | Resp 18 | Ht 63.0 in | Wt 151.4 lb

## 2023-10-03 DIAGNOSIS — I10 Essential (primary) hypertension: Secondary | ICD-10-CM | POA: Diagnosis not present

## 2023-10-03 DIAGNOSIS — F5101 Primary insomnia: Secondary | ICD-10-CM

## 2023-10-03 DIAGNOSIS — F411 Generalized anxiety disorder: Secondary | ICD-10-CM | POA: Diagnosis not present

## 2023-10-03 MED ORDER — BUSPIRONE HCL 5 MG PO TABS
5.0000 mg | ORAL_TABLET | Freq: Two times a day (BID) | ORAL | 1 refills | Status: DC
  Filled 2023-10-03 – 2023-10-24 (×4): qty 180, 90d supply, fill #0

## 2023-10-03 MED ORDER — TEMAZEPAM 15 MG PO CAPS
15.0000 mg | ORAL_CAPSULE | Freq: Every evening | ORAL | 0 refills | Status: DC | PRN
  Filled 2023-10-03: qty 30, 30d supply, fill #0

## 2023-10-03 MED ORDER — ESCITALOPRAM OXALATE 10 MG PO TABS
10.0000 mg | ORAL_TABLET | Freq: Every day | ORAL | 1 refills | Status: DC
  Filled 2023-10-03 – 2023-10-24 (×3): qty 90, 90d supply, fill #0
  Filled 2024-01-12: qty 90, 90d supply, fill #1

## 2023-10-03 NOTE — Patient Instructions (Signed)
 It was very nice to see you today!  Try the temazepam  rather than the clonazepam  for sleep.     PLEASE NOTE:  If you had any lab tests please let us  know if you have not heard back within a few days. You may see your results on MyChart before we have a chance to review them but we will give you a call once they are reviewed by us . If we ordered any referrals today, please let us  know if you have not heard from their office within the next week.   Please try these tips to maintain a healthy lifestyle:  Eat most of your calories during the day when you are active. Eliminate processed foods including packaged sweets (pies, cakes, cookies), reduce intake of potatoes, white bread, white pasta, and white rice. Look for whole grain options, oat flour or almond flour.  Each meal should contain half fruits/vegetables, one quarter protein, and one quarter carbs (no bigger than a computer mouse).  Cut down on sweet beverages. This includes juice, soda, and sweet tea. Also watch fruit intake, though this is a healthier sweet option, it still contains natural sugar! Limit to 3 servings daily.  Drink at least 1 glass of water with each meal and aim for at least 8 glasses per day  Exercise at least 150 minutes every week.

## 2023-10-03 NOTE — Progress Notes (Signed)
 Subjective:     Patient ID: Kristy Hebert, female    DOB: 12/09/1940, 83 y.o.   MRN: [id number]  Chief Complaint  Patient presents with   Medical Management of Chronic Issues    4 week follow-up Discuss medications that she take at night Not fasting      HPI  Discussed the use of AI scribe software for clinical note transcription with the patient, who gave verbal consent to proceed.  History of Present Illness Kristy Hebert is an 83 year old female who presents for a follow-up regarding blood pressure management and medication adjustment.  She engages in regular physical activity, walking for 25 to 30 minutes daily at a good speed. Her heart rate occasionally drops to the 40s and 50s at home, but she does not feel unwell during these episodes. She reports that she has not felt bad since starting Lexapro .  She has been monitoring her blood pressure weekly. On September 1st, her readings were 174/82, 164/75, and 148/?SABRA She may not have been sitting for the recommended five minutes before taking these measurements. She resumed a 10 mg dose of Lexapro  for anxiety on September 10th, after previously reducing it to 5 mg due to an episode of high blood pressure and low heart rate. Since resuming the higher dose, her blood pressure readings have improved, with a reading of 131/64 on September 10th and 134/69 on September 17th.  She received a flu shot recently while picking up her Buspar  prescription. Buspar  has been beneficial, particularly in calming her and improving her sleep. She takes it twice daily, with meals, and has noticed a positive effect on her anxiety and sleep quality.  She has been tapering her clonazepam  dosage from 3 mg to 2.25 mg at night, which she finds more effective when combined with Lexapro . Lexapro  has helped her feel less tense and more able to manage daily activities without excessive worry. She has been on Lexapro  since September 10th and feels it has  improved her morning routine and overall mood. No SI.    She maintains a healthy lifestyle, avoiding sweets and preparing balanced meals. She values her walking routine for both physical and mental health benefits, noting that it helps her stay active and engaged with her surroundings.  No dizziness, lightheadedness, or excessive weakness despite her heart rate occasionally dropping to the 40s and 50s.    Health Maintenance Due  Topic Date Due   Medicare Annual Wellness (AWV)  11/11/2023    Past Medical History:  Diagnosis Date   Anxiety    Complication of anesthesia    Depression    GERD (gastroesophageal reflux disease)    Glaucoma    Hyperglycemia    Hyperlipidemia    Hypertension    Insomnia    Obesity    PONV (postoperative nausea and vomiting)    with ankle surgery   Vitamin D deficiency     Past Surgical History:  Procedure Laterality Date   ANKLE SURGERY Left    CATARACT EXTRACTION, BILATERAL N/A 2021   CHOLECYSTECTOMY     COLONOSCOPY WITH PROPOFOL  N/A 07/01/2016   Procedure: COLONOSCOPY WITH PROPOFOL ;  Surgeon: Vicci Gladis POUR, MD;  Location: [facility];  Service: Endoscopy;  Laterality: N/A;   EYE SURGERY       Current Outpatient Medications:    clonazePAM  (KLONOPIN ) 1 MG tablet, Take 1 tablet (1 mg total) by mouth at bedtime. (Take with your 2mg  tablet at bedtime for a total of 3mg .,  Disp: 90 tablet, Rfl: 0   clonazePAM  (KLONOPIN ) 2 MG tablet, Take 1 tablet (2 mg total) by mouth at bedtime. Take with your 1 mg tablet at night for a total of 3 mg., Disp: 90 tablet, Rfl: 0   dorzolamide -timolol  (COSOPT ) 2-0.5 % ophthalmic solution, Place 1 drop into both eyes 2 (two) times daily., Disp: 20 mL, Rfl: 4   dorzolamide -timolol  (COSOPT ) 22.3-6.8 MG/ML ophthalmic solution, Place 1 drop into both eyes 2 (two) times daily., Disp: , Rfl:    felodipine  (PLENDIL ) 10 MG 24 hr tablet, Take 1 tablet (10 mg total) by mouth daily., Disp: 90 tablet, Rfl: 3   fluticasone   (FLONASE ) 50 MCG/ACT nasal spray, Place 1 spray into both nostrils daily., Disp: 16 g, Rfl: 11   hydrALAZINE  (APRESOLINE ) 10 MG tablet, Take 1-2 tablets (10-20 mg total) by mouth 2 (two) times daily. Can take an extra 1-2 tablets as needed for systolic BP > 160, Disp: 150 tablet, Rfl: 3   telmisartan  (MICARDIS ) 40 MG tablet, Take 2 tablets (80 mg total) by mouth at night., Disp: 180 tablet, Rfl: 3   temazepam  (RESTORIL ) 15 MG capsule, Take 1 capsule (15 mg total) by mouth at bedtime as needed for sleep., Disp: 30 capsule, Rfl: 0   Travoprost , BAK Free, (TRAVATAN ) 0.004 % SOLN ophthalmic solution, Place 1 drop into both eyes every evening., Disp: 10 mL, Rfl: 3   busPIRone  (BUSPAR ) 5 MG tablet, Take 1 tablet (5 mg total) by mouth 2 (two) times daily., Disp: 180 tablet, Rfl: 1   escitalopram  (LEXAPRO ) 10 MG tablet, Take 1 tablet (10 mg total) by mouth daily., Disp: 90 tablet, Rfl: 1  Allergies  Allergen Reactions   Hydralazine      dizziness   Telmisartan      Heart raced on 80mg  tab. Tolerates 2 of the 40mg  tabs.   ROS neg/noncontributory except as noted HPI/below      Objective:     BP (!) 130/58 (BP Location: Left Arm, Patient Position: Sitting, Cuff Size: Normal)   Pulse 63   Temp 98.2 F (36.8 C) (Temporal)   Resp 18   Ht 5' 3 (1.6 m)   Wt 151 lb 6 oz (68.7 kg)   SpO2 96%   BMI 26.81 kg/m  Wt Readings from Last 3 Encounters:  10/03/23 151 lb 6 oz (68.7 kg)  09/03/23 152 lb 8 oz (69.2 kg)  08/14/23 150 lb 2 oz (68.1 kg)    Physical Exam   Gen: WDWN NAD HEENT: NCAT, conjunctiva not injected, sclera nonicteric CARDIAC: RRR, S1S2+, no murmur. EXT:  no edema MSK: no gross abnormalities.  NEURO: A&O x3.  CN II-XII intact.  PSYCH: normal mood. Good eye contact     Assessment & Plan:  Primary hypertension  Generalized anxiety disorder -     busPIRone  HCl; Take 1 tablet (5 mg total) by mouth 2 (two) times daily.  Dispense: 180 tablet; Refill: 1 -     Escitalopram   Oxalate; Take 1 tablet (10 mg total) by mouth daily.  Dispense: 90 tablet; Refill: 1  Primary insomnia -     Temazepam ; Take 1 capsule (15 mg total) by mouth at bedtime as needed for sleep.  Dispense: 30 capsule; Refill: 0  Assessment and Plan Assessment & Plan Hypertension   Blood pressure control has improved with the felodipine  10 mg and telmisartan  80mg . She understands the importance of proper blood pressure measurement. Continue the medication, educate on measurement technique, and monitor blood pressure regularly.  Insomnia  Chronic insomnia is currently managed with clonazepam , which causes daytime drowsiness and cognitive slowing. Transition to temazepam  15 mg is planned to enhance sleep quality and reduce side effects. Discontinue clonazepam , start temazepam  15 mg for sleep, and instruct to take a second dose if needed. Monitor response and adjust dosage as necessary.  Depression and Generalized Anxiety Disorder   Lexapro  and Buspar  effectively manage depression and anxiety. Lexapro  improves mood and anxiety, while Buspar , taken twice daily, aids in anxiety management. She reports improved mood and daily activity management. Continue Lexapro  and Buspar  as prescribed and monitor mood and anxiety symptoms.  Asymptomatic Bradycardia   Heart rate occasionally drops to 49 bpm, likely due to increased physical activity, without symptoms like dizziness or fatigue. Continue monitoring heart rate and advise reporting any symptoms.  General Health Maintenance   She recently received a flu vaccination, engages in regular physical activity, and maintains a healthy diet. Continue regular physical activity, maintain a healthy diet, and ensure vaccinations are up to date.    Return in about 4 months (around 02/02/2024) for HTN, mood.  Jenkins CHRISTELLA Carrel, MD

## 2023-10-07 ENCOUNTER — Other Ambulatory Visit: Payer: Self-pay | Admitting: Family Medicine

## 2023-10-07 ENCOUNTER — Telehealth: Payer: Self-pay | Admitting: *Deleted

## 2023-10-07 NOTE — Telephone Encounter (Signed)
 Copied from CRM [id number]. Topic: General - Other >> Oct 07, 2023  8:49 AM Macario HERO wrote: Reason for CRM: Patient is calling to let provider know she needs to stay on her clonazePAM  (KLONOPIN ). Requesting a follow up from nurse.  Patient stated she is doing good on medication and will stay on it.

## 2023-10-07 NOTE — Telephone Encounter (Signed)
 Patient stated she tried it and does not like the side effects, will stick to the Klonopin .

## 2023-10-15 ENCOUNTER — Other Ambulatory Visit: Payer: Self-pay | Admitting: Family Medicine

## 2023-10-15 DIAGNOSIS — Z1231 Encounter for screening mammogram for malignant neoplasm of breast: Secondary | ICD-10-CM

## 2023-10-21 ENCOUNTER — Other Ambulatory Visit: Payer: Self-pay

## 2023-10-21 ENCOUNTER — Other Ambulatory Visit (HOSPITAL_BASED_OUTPATIENT_CLINIC_OR_DEPARTMENT_OTHER): Payer: Self-pay

## 2023-10-22 ENCOUNTER — Other Ambulatory Visit: Payer: Self-pay

## 2023-10-22 ENCOUNTER — Other Ambulatory Visit (HOSPITAL_BASED_OUTPATIENT_CLINIC_OR_DEPARTMENT_OTHER): Payer: Self-pay

## 2023-10-24 ENCOUNTER — Other Ambulatory Visit (HOSPITAL_BASED_OUTPATIENT_CLINIC_OR_DEPARTMENT_OTHER): Payer: Self-pay

## 2023-10-24 ENCOUNTER — Other Ambulatory Visit: Payer: Self-pay

## 2023-10-29 ENCOUNTER — Other Ambulatory Visit (HOSPITAL_BASED_OUTPATIENT_CLINIC_OR_DEPARTMENT_OTHER): Payer: Self-pay

## 2023-11-03 ENCOUNTER — Ambulatory Visit (INDEPENDENT_AMBULATORY_CARE_PROVIDER_SITE_OTHER)

## 2023-11-03 VITALS — BP 136/64 | HR 73 | Temp 98.5°F | Ht 63.0 in | Wt 150.6 lb

## 2023-11-03 DIAGNOSIS — Z0000 Encounter for general adult medical examination without abnormal findings: Secondary | ICD-10-CM | POA: Diagnosis not present

## 2023-11-03 NOTE — Progress Notes (Signed)
 Subjective:   Kristy Hebert is a 83 y.o. who presents for a Medicare Wellness preventive visit.  As a reminder, Annual Wellness Visits don't include a physical exam, and some assessments may be limited, especially if this visit is performed virtually. We may recommend an in-person follow-up visit with your provider if needed.  Visit Complete: In person    Persons Participating in Visit: Patient.  AWV Questionnaire: Yes: Patient Medicare AWV questionnaire was completed by the patient on 10/27/23; I have confirmed that all information answered by patient is correct and no changes since this date.  Cardiac Risk Factors include: advanced age (>75men, >8 women);dyslipidemia;hypertension     Objective:    Today's Vitals   11/03/23 1148  BP: (!) 140/68  Pulse: 73  Temp: 98.5 F (36.9 C)  SpO2: 97%  Weight: 150 lb 9.6 oz (68.3 kg)  Height: 5' 3 (1.6 m)   Body mass index is 26.68 kg/m.     11/03/2023   12:01 PM 11/11/2022    2:09 PM 07/01/2016    7:26 AM 06/28/2016    3:56 PM  Advanced Directives  Does Patient Have a Medical Advance Directive? Yes Yes Yes  Yes   Type of Estate agent of [place];Living will Healthcare Power of [place];Living will Living will Living will  Does patient want to make changes to medical advance directive?  No - Patient declined  No - Patient declined   Copy of Healthcare Power of Attorney in Chart? No - copy requested Yes - validated most recent copy scanned in chart (See row information) No - copy requested       Data saved with a previous flowsheet row definition    Current Medications (verified) Outpatient Encounter Medications as of 11/03/2023  Medication Sig   busPIRone  (BUSPAR ) 5 MG tablet Take 1 tablet (5 mg total) by mouth 2 (two) times daily.   clonazePAM  (KLONOPIN ) 1 MG tablet Take 1 tablet (1 mg total) by mouth at bedtime. (Take with your 2mg  tablet at bedtime for a total of 3mg .   clonazePAM  (KLONOPIN ) 2  MG tablet Take 1 tablet (2 mg total) by mouth at bedtime. Take with your 1 mg tablet at night for a total of 3 mg.   dorzolamide -timolol  (COSOPT ) 2-0.5 % ophthalmic solution Place 1 drop into both eyes 2 (two) times daily.   escitalopram  (LEXAPRO ) 10 MG tablet Take 1 tablet (10 mg total) by mouth daily.   felodipine  (PLENDIL ) 10 MG 24 hr tablet Take 1 tablet (10 mg total) by mouth daily.   fluticasone  (FLONASE ) 50 MCG/ACT nasal spray Place 1 spray into both nostrils daily.   hydrALAZINE  (APRESOLINE ) 10 MG tablet Take 1-2 tablets (10-20 mg total) by mouth 2 (two) times daily. Can take an extra 1-2 tablets as needed for systolic BP > 160   telmisartan  (MICARDIS ) 40 MG tablet Take 2 tablets (80 mg total) by mouth at night.   Travoprost , BAK Free, (TRAVATAN ) 0.004 % SOLN ophthalmic solution Place 1 drop into both eyes every evening.   [DISCONTINUED] dorzolamide -timolol  (COSOPT ) 22.3-6.8 MG/ML ophthalmic solution Place 1 drop into both eyes 2 (two) times daily.   No facility-administered encounter medications on file as of 11/03/2023.    Allergies (verified) Hydralazine  and Telmisartan    History: Past Medical History:  Diagnosis Date   Anxiety    Complication of anesthesia    Depression    GERD (gastroesophageal reflux disease)    Glaucoma    Hyperglycemia    Hyperlipidemia  Hypertension    Insomnia    Obesity    PONV (postoperative nausea and vomiting)    with ankle surgery   Vitamin D deficiency    Past Surgical History:  Procedure Laterality Date   ANKLE SURGERY Left    CATARACT EXTRACTION, BILATERAL N/A 2021   CHOLECYSTECTOMY     COLONOSCOPY WITH PROPOFOL  N/A 07/01/2016   Procedure: COLONOSCOPY WITH PROPOFOL ;  Surgeon: Vicci Gladis POUR, MD;  Location: [facility];  Service: Endoscopy;  Laterality: N/A;   EYE SURGERY     Family History  Problem Relation Age of Onset   Hypertension Mother    Social History   Socioeconomic History   Marital status: Married    Spouse  name: Not on file   Number of children: 2   Years of education: Not on file   Highest education level: 12th grade  Occupational History   Not on file  Tobacco Use   Smoking status: Never   Smokeless tobacco: Never  Vaping Use   Vaping status: Never Used  Substance and Sexual Activity   Alcohol use: Never   Drug use: Never   Sexual activity: Not Currently    Partners: Male  Other Topics Concern   Not on file  Social History Narrative   Adopted children.  No grands   Retired Producer, television/film/video.   Social Drivers of Corporate investment banker Strain: Low Risk  (10/27/2023)   Overall Financial Resource Strain (CARDIA)    Difficulty of Paying Living Expenses: Not hard at all  Food Insecurity: No Food Insecurity (10/27/2023)   Hunger Vital Sign    Worried About Running Out of Food in the Last Year: Never true    Ran Out of Food in the Last Year: Never true  Transportation Needs: No Transportation Needs (10/27/2023)   PRAPARE - Administrator, Civil Service (Medical): No    Lack of Transportation (Non-Medical): No  Physical Activity: Sufficiently Active (10/27/2023)   Exercise Vital Sign    Days of Exercise per Week: 6 days    Minutes of Exercise per Session: 30 min  Stress: Stress Concern Present (10/27/2023)   [employer] of Occupational Health - Occupational Stress Questionnaire    Feeling of Stress: To some extent  Social Connections: Moderately Isolated (10/27/2023)   Social Connection and Isolation Panel    Frequency of Communication with Friends and Family: More than three times a week    Frequency of Social Gatherings with Friends and Family: Three times a week    Attends Religious Services: Patient declined    Active Member of Clubs or Organizations: Patient declined    Attends Banker Meetings: Never    Marital Status: Married    Tobacco Counseling Counseling given: Not Answered    Clinical Intake:  Pre-visit preparation completed:  Yes  Pain : No/denies pain     BMI - recorded: 26.68 Nutritional Status: BMI 25 -29 Overweight Nutritional Risks: None Diabetes: No  Lab Results  Component Value Date   HGBA1C 5.8 07/22/2023     How often do you need to have someone help you when you read instructions, pamphlets, or other written materials from your doctor or pharmacy?: 1 - Never  Interpreter Needed?: No  Information entered by :: Ellouise Haws, LPN   Activities of Daily Living     10/27/2023    6:32 PM 11/04/2022   10:32 AM  In your present state of health, do you have any difficulty performing  the following activities:  Hearing? 0 0  Vision? 0 0  Difficulty concentrating or making decisions? 0 0  Walking or climbing stairs? 0 0  Dressing or bathing? 0 0  Doing errands, shopping? 0 0  Preparing Food and eating ? N N  Using the Toilet? N N  In the past six months, have you accidently leaked urine? N N  Do you have problems with loss of bowel control? N N  Managing your Medications? N N  Managing your Finances? N N  Housekeeping or managing your Housekeeping? N N    Patient Care Team: Wendolyn Jenkins Jansky, MD as PCP - General (Family Medicine) Jeffrie Oneil BROCKS, MD as PCP - Cardiology (Cardiology) Patrcia Sharper, MD as PCP - Ophthalmology (Ophthalmology) Marylynn, Megan E, RPH-CPP (Inactive) (Pharmacist)  I have updated your Care Teams any recent Medical Services you may have received from other providers in the past year.     Assessment:   This is a routine wellness examination for Kristy Hebert.  Hearing/Vision screen Hearing Screening - Comments:: Pt denies any hearing issues  Vision Screening - Comments:: Wears rx glasses - up to date with routine eye exams with Dr Patrcia    Goals Addressed             This Visit's Progress    Patient Stated       Maintain health and activity        Depression Screen     11/03/2023   11:59 AM 10/03/2023   10:22 AM 07/22/2023   10:41 AM 03/06/2023    10:22 AM 12/04/2022    1:57 PM 11/11/2022    2:08 PM 11/01/2022    1:03 PM  PHQ 2/9 Scores  PHQ - 2 Score 0 0 2 0 0 0 0  PHQ- 9 Score 0 1 9 2 2  0 0    Fall Risk     10/27/2023    6:32 PM 08/14/2023   10:03 AM 12/04/2022    1:57 PM 11/04/2022   10:32 AM 11/01/2022    1:02 PM  Fall Risk   Falls in the past year? 0 0 0 0 0  Number falls in past yr: 0 0 0  0  Injury with Fall? 0 0 0  0  Risk for fall due to : No Fall Risks No Fall Risks No Fall Risks No Fall Risks No Fall Risks  Follow up Falls prevention discussed Falls evaluation completed Falls evaluation completed Falls prevention discussed Falls evaluation completed    MEDICARE RISK AT HOME:  Medicare Risk at Home Any stairs in or around the home?: (Patient-Rptd) No If so, are there any without handrails?: (Patient-Rptd) No Home free of loose throw rugs in walkways, pet beds, electrical cords, etc?: (Patient-Rptd) Yes Adequate lighting in your home to reduce risk of falls?: (Patient-Rptd) Yes Life alert?: (Patient-Rptd) No Use of a cane, walker or w/c?: (Patient-Rptd) No Grab bars in the bathroom?: (Patient-Rptd) Yes Shower chair or bench in shower?: (Patient-Rptd) Yes Elevated toilet seat or a handicapped toilet?: (Patient-Rptd) No  TIMED UP AND GO:  Was the test performed?  Yes  Length of time to ambulate 10 feet: 10 sec Gait steady and fast without use of assistive device  Cognitive Function: 6CIT completed        11/03/2023   12:01 PM 11/11/2022    2:10 PM  6CIT Screen  What Year? 0 points 0 points  What month? 0 points 0 points  What time? 0 points  0 points  Count back from 20 0 points 0 points  Months in reverse 0 points 0 points  Repeat phrase 0 points 0 points  Total Score 0 points 0 points    Immunizations Immunization History  Administered Date(s) Administered   Fluad  Quad(high Dose 65+) 09/15/2018   Fluad  Trivalent(High Dose 65+) 10/30/2022   INFLUENZA, HIGH DOSE SEASONAL PF 09/30/2016,  09/20/2018, 09/29/2023   Influenza Split 11/25/2008, 10/30/2009, 10/01/2010, 09/30/2012, 12/04/2012, 10/01/2013, 10/04/2014, 09/15/2018, 09/27/2019   Influenza-Unspecified 09/23/2011, 09/14/2013   PFIZER(Purple Top)SARS-COV-2 Vaccination 03/08/2019, 03/29/2019, 10/17/2019   PNEUMOCOCCAL CONJUGATE-20 07/18/2021   Pneumococcal Conjugate-13 03/30/2015   Pneumococcal Polysaccharide-23 01/25/2009   Td (Adult) 09/23/2007   Zoster, Live 02/27/2009    Screening Tests Health Maintenance  Topic Date Due   DEXA SCAN  Never done   DTaP/Tdap/Td (1 - Tdap) 09/24/2007   COVID-19 Vaccine (4 - 2025-26 season) 09/15/2023   Zoster Vaccines- Shingrix (1 of 2) 01/06/2024 (Originally 08/18/1959)   Medicare Annual Wellness (AWV)  11/02/2024   Pneumococcal Vaccine: 50+ Years  Completed   Influenza Vaccine  Completed   Meningococcal B Vaccine  Aged Out    Health Maintenance Items Addressed: See Nurse Notes at the end of this note  Additional Screening:  Vision Screening: Recommended annual ophthalmology exams for early detection of glaucoma and other disorders of the eye. Is the patient up to date with their annual eye exam?  Yes  Who is the provider or what is the name of the office in which the patient attends annual eye exams? Dr Patrcia   Dental Screening: Recommended annual dental exams for proper oral hygiene  Community Resource Referral / Chronic Care Management: CRR required this visit?  No   CCM required this visit?  No   Plan:    I have personally reviewed and noted the following in the patient's chart:   Medical and social history Use of alcohol, tobacco or illicit drugs  Current medications and supplements including opioid prescriptions. Patient is not currently taking opioid prescriptions. Functional ability and status Nutritional status Physical activity Advanced directives List of other physicians Hospitalizations, surgeries, and ER visits in previous 12  months Vitals Screenings to include cognitive, depression, and falls Referrals and appointments  In addition, I have reviewed and discussed with patient certain preventive protocols, quality metrics, and best practice recommendations. A written personalized care plan for preventive services as well as general preventive health recommendations were provided to patient.   Ellouise VEAR Haws, LPN   [id number]   After Visit Summary: (In Person-Printed) AVS printed and given to the patient  Notes: Nothing significant to report at this time.

## 2023-11-03 NOTE — Patient Instructions (Signed)
 Kristy Hebert,  Thank you for taking the time for your Medicare Wellness Visit. I appreciate your continued commitment to your health goals. Please review the care plan we discussed, and feel free to reach out if I can assist you further.  Medicare recommends these wellness visits once per year to help you and your care team stay ahead of potential health issues. These visits are designed to focus on prevention, allowing your provider to concentrate on managing your acute and chronic conditions during your regular appointments.  Please note that Annual Wellness Visits do not include a physical exam. Some assessments may be limited, especially if the visit was conducted virtually. If needed, we may recommend a separate in-person follow-up with your provider.  Ongoing Care Seeing your primary care provider every 3 to 6 months helps us  monitor your health and provide consistent, personalized care.   Referrals If a referral was made during today's visit and you haven't received any updates within two weeks, please contact the referred provider directly to check on the status.  Recommended Screenings:  Health Maintenance  Topic Date Due   DEXA scan (bone density measurement)  Never done   DTaP/Tdap/Td vaccine (1 - Tdap) 09/24/2007   COVID-19 Vaccine (4 - 2025-26 season) 09/15/2023   Medicare Annual Wellness Visit  11/11/2023   Zoster (Shingles) Vaccine (1 of 2) 01/06/2024*   Pneumococcal Vaccine for age over 66  Completed   Flu Shot  Completed   Meningitis B Vaccine  Aged Out  *Topic was postponed. The date shown is not the original due date.       11/11/2022    2:09 PM  Advanced Directives  Does Patient Have a Medical Advance Directive? Yes  Type of Estate agent of [place];Living will  Does patient want to make changes to medical advance directive? No - Patient declined  Copy of Healthcare Power of Attorney in Chart? Yes - validated most recent copy scanned in  chart (See row information)   Advance Care Planning is important because it: Ensures you receive medical care that aligns with your values, goals, and preferences. Provides guidance to your family and loved ones, reducing the emotional burden of decision-making during critical moments.  Vision: Annual vision screenings are recommended for early detection of glaucoma, cataracts, and diabetic retinopathy. These exams can also reveal signs of chronic conditions such as diabetes and high blood pressure.  Dental: Annual dental screenings help detect early signs of oral cancer, gum disease, and other conditions linked to overall health, including heart disease and diabetes.  Please see the attached documents for additional preventive care recommendations.

## 2023-11-10 ENCOUNTER — Other Ambulatory Visit (HOSPITAL_BASED_OUTPATIENT_CLINIC_OR_DEPARTMENT_OTHER): Payer: Self-pay

## 2023-11-10 ENCOUNTER — Other Ambulatory Visit: Payer: Self-pay | Admitting: Family Medicine

## 2023-11-10 DIAGNOSIS — F411 Generalized anxiety disorder: Secondary | ICD-10-CM

## 2023-11-10 MED ORDER — CLONAZEPAM 2 MG PO TABS
2.0000 mg | ORAL_TABLET | Freq: Every day | ORAL | 0 refills | Status: DC
  Filled 2023-11-10: qty 30, 30d supply, fill #0
  Filled 2023-11-17: qty 90, 90d supply, fill #0

## 2023-11-10 MED ORDER — CLONAZEPAM 1 MG PO TABS
1.0000 mg | ORAL_TABLET | Freq: Every day | ORAL | 0 refills | Status: DC
  Filled 2023-11-10: qty 30, 30d supply, fill #0
  Filled 2023-11-17: qty 90, 90d supply, fill #0

## 2023-11-11 ENCOUNTER — Other Ambulatory Visit (HOSPITAL_BASED_OUTPATIENT_CLINIC_OR_DEPARTMENT_OTHER): Payer: Self-pay

## 2023-11-17 ENCOUNTER — Other Ambulatory Visit (HOSPITAL_BASED_OUTPATIENT_CLINIC_OR_DEPARTMENT_OTHER): Payer: Self-pay

## 2023-11-21 ENCOUNTER — Telehealth: Payer: Self-pay

## 2023-11-21 NOTE — Telephone Encounter (Signed)
 Called and notified patient of the note per Dr. Wendolyn. Patient verbalized understanding and agreed to try the new treatment plan.

## 2023-11-21 NOTE — Telephone Encounter (Signed)
 Copied from CRM [id number]. Topic: Clinical - Medication Question >> Nov 21, 2023  8:31 AM Kristy Hebert wrote: Reason for CRM: Patient called.. wants to know if she can use her 5mg  that she has on hand to add to her escitalopram  (LEXAPRO ) 10 MG tablet to make it a full 15mg ?  Pls call and review with her

## 2024-01-12 ENCOUNTER — Telehealth: Payer: Self-pay

## 2024-01-12 ENCOUNTER — Other Ambulatory Visit: Payer: Self-pay

## 2024-01-12 ENCOUNTER — Other Ambulatory Visit: Payer: Self-pay | Admitting: Family Medicine

## 2024-01-12 ENCOUNTER — Other Ambulatory Visit (HOSPITAL_BASED_OUTPATIENT_CLINIC_OR_DEPARTMENT_OTHER): Payer: Self-pay

## 2024-01-12 DIAGNOSIS — F411 Generalized anxiety disorder: Secondary | ICD-10-CM

## 2024-01-12 MED ORDER — BUSPIRONE HCL 5 MG PO TABS
5.0000 mg | ORAL_TABLET | Freq: Two times a day (BID) | ORAL | 1 refills | Status: AC
  Filled 2024-01-12: qty 180, 90d supply, fill #0

## 2024-01-12 NOTE — Telephone Encounter (Signed)
 Worked from other note

## 2024-01-12 NOTE — Telephone Encounter (Signed)
 Copied from CRM #[id number]. Topic: General - Call Back - No Documentation >> Jan 12, 2024 12:03 PM Eva FALCON wrote: Reason for CRM: Pt was returning call to [facility], she states its the 5 MG tablet that she needs a refill one. Any other questions please call [phone].  Refill sent

## 2024-01-12 NOTE — Telephone Encounter (Signed)
Called pt - left message for return call.

## 2024-01-13 ENCOUNTER — Other Ambulatory Visit: Payer: Self-pay

## 2024-01-13 ENCOUNTER — Other Ambulatory Visit (HOSPITAL_BASED_OUTPATIENT_CLINIC_OR_DEPARTMENT_OTHER): Payer: Self-pay

## 2024-01-13 ENCOUNTER — Telehealth: Payer: Self-pay

## 2024-01-13 DIAGNOSIS — F411 Generalized anxiety disorder: Secondary | ICD-10-CM

## 2024-01-13 MED ORDER — ESCITALOPRAM OXALATE 5 MG PO TABS
5.0000 mg | ORAL_TABLET | Freq: Every day | ORAL | 0 refills | Status: DC
  Filled 2024-01-13 (×2): qty 30, 30d supply, fill #0

## 2024-01-13 MED ORDER — ESCITALOPRAM OXALATE 10 MG PO TABS
10.0000 mg | ORAL_TABLET | Freq: Every day | ORAL | 1 refills | Status: AC
  Filled 2024-01-13 (×2): qty 90, 90d supply, fill #0

## 2024-01-13 MED ORDER — ESCITALOPRAM OXALATE 10 MG PO TABS
5.0000 mg | ORAL_TABLET | Freq: Every day | ORAL | 1 refills | Status: DC
  Filled 2024-01-13: qty 90, 180d supply, fill #0
  Filled 2024-01-13: qty 45, 90d supply, fill #0

## 2024-01-13 NOTE — Telephone Encounter (Signed)
 Sent 5 mg in

## 2024-01-13 NOTE — Telephone Encounter (Signed)
 Pt stated she is on 15mg  total 1 10mg  and 1 5mg  daily resent in 10mg    Copied from CRM #[id number]. Topic: Clinical - Medical Advice >> Jan 13, 2024 11:59 AM Chasity T wrote: Reason for CRM: Patient is calling to get clarification on medication escitalopram  (LEXAPRO ) 10 MG tablet. She is wanting to know why the 10 mg is saying to take 0.5 tabs  now instead of a whole one. Please contact her back for clarification.  Phone number: [phone]

## 2024-01-19 ENCOUNTER — Other Ambulatory Visit (HOSPITAL_BASED_OUTPATIENT_CLINIC_OR_DEPARTMENT_OTHER): Payer: Self-pay

## 2024-01-23 ENCOUNTER — Ambulatory Visit
Admission: RE | Admit: 2024-01-23 | Discharge: 2024-01-23 | Disposition: A | Source: Ambulatory Visit | Attending: Family Medicine | Admitting: Family Medicine

## 2024-01-23 DIAGNOSIS — Z1231 Encounter for screening mammogram for malignant neoplasm of breast: Secondary | ICD-10-CM

## 2024-01-27 ENCOUNTER — Ambulatory Visit: Payer: Self-pay | Admitting: Family Medicine

## 2024-02-03 ENCOUNTER — Encounter: Payer: Self-pay | Admitting: Family Medicine

## 2024-02-03 ENCOUNTER — Other Ambulatory Visit (HOSPITAL_BASED_OUTPATIENT_CLINIC_OR_DEPARTMENT_OTHER): Payer: Self-pay

## 2024-02-03 ENCOUNTER — Ambulatory Visit: Admitting: Family Medicine

## 2024-02-03 VITALS — BP 138/70 | HR 76 | Temp 97.0°F | Ht 63.0 in | Wt 152.0 lb

## 2024-02-03 DIAGNOSIS — I10 Essential (primary) hypertension: Secondary | ICD-10-CM

## 2024-02-03 DIAGNOSIS — H4010X1 Unspecified open-angle glaucoma, mild stage: Secondary | ICD-10-CM

## 2024-02-03 DIAGNOSIS — F5101 Primary insomnia: Secondary | ICD-10-CM

## 2024-02-03 DIAGNOSIS — F411 Generalized anxiety disorder: Secondary | ICD-10-CM

## 2024-02-03 MED ORDER — ESCITALOPRAM OXALATE 5 MG PO TABS
5.0000 mg | ORAL_TABLET | Freq: Every day | ORAL | 1 refills | Status: AC
  Filled 2024-02-03 – 2024-02-04 (×2): qty 90, 90d supply, fill #0

## 2024-02-03 MED ORDER — CLONAZEPAM 2 MG PO TABS
2.0000 mg | ORAL_TABLET | Freq: Every day | ORAL | 0 refills | Status: AC
  Filled 2024-02-03 – 2024-02-13 (×3): qty 90, 90d supply, fill #0

## 2024-02-03 NOTE — Patient Instructions (Signed)
 Bioteen for mouth.spray

## 2024-02-03 NOTE — Progress Notes (Signed)
 "  Subjective:     Patient ID: Kristy Hebert, female    DOB: 04/06/1940, 83 y.o.   MRN: [id number]  Chief Complaint  Patient presents with   Hypertension    Pt is here for chronic issues    Discussed the use of AI scribe software for clinical note transcription with the patient, who gave verbal consent to proceed.  History of Present Illness Kristy Hebert is an 84 year old female with hypertension and anxiety who presents for medication management and follow-up.  Her blood pressure is generally well-controlled with readings typically in the 130s to 160s. She mentions an isolated reading of 160 in the afternoon, which she managed to lower. She attributes her improved blood pressure to daily walking, which she enjoys and finds beneficial for her mental health. Her current medications for blood pressure include felodipine  10 mg and telmasartan 40 mg, taken two daily. She has hydralazine  available but has not needed to use it recently. She reports no chest pain or shortness of breath.  She is taking escitalopram  for anxiety, initially starting at 5 mg, then increasing to 10 mg, and currently taking 15 mg daily. She reports doing well on this dose, with improved anxiety management and no suicidal thoughts.  She is also taking buspirone  5 mg twice daily for anxiety, which she finds helpful. Additionally, she takes clonazepam , having transitioned from 1 mg to 2 mg, which she reports helps with anxiety and blood pressure control. However, she experiences excessive saliva, particularly at night, which she manages by drinking water during the day. She has a history of using temazepam  for sleep, which she discontinued due to side effects. She notes that her thinking has improved since adjusting her medications, although she avoids certain situations that trigger anxiety.  She has a history of glaucoma and has not had any recent changes to her eye drop regimen. She also mentions having dentures,  which she removes at night, contributing to her saliva issues(which is thick).  She enjoys walking daily and finds it beneficial for her mental health.    There are no preventive care reminders to display for this patient.   Past Medical History:  Diagnosis Date   Anxiety    Complication of anesthesia    Depression    GERD (gastroesophageal reflux disease)    Glaucoma    Hyperglycemia    Hyperlipidemia    Hypertension    Insomnia    Obesity    PONV (postoperative nausea and vomiting)    with ankle surgery   Vitamin D deficiency     Past Surgical History:  Procedure Laterality Date   ANKLE SURGERY Left    CATARACT EXTRACTION, BILATERAL N/A 2021   CHOLECYSTECTOMY     COLONOSCOPY WITH PROPOFOL  N/A 07/01/2016   Procedure: COLONOSCOPY WITH PROPOFOL ;  Surgeon: Vicci Gladis POUR, MD;  Location: [facility];  Service: Endoscopy;  Laterality: N/A;   EYE SURGERY      Current Medications[1]  Allergies[2] ROS neg/noncontributory except as noted HPI/below      Objective:     BP 138/70 (BP Location: Left Arm, Patient Position: Sitting, Cuff Size: Normal)   Pulse 76   Temp (!) 97 F (36.1 C) (Temporal)   Ht 5' 3 (1.6 m)   Wt 152 lb (68.9 kg)   SpO2 98%   BMI 26.93 kg/m  Wt Readings from Last 3 Encounters:  02/03/24 152 lb (68.9 kg)  11/03/23 150 lb 9.6 oz (68.3 kg)  10/03/23 151  lb 6 oz (68.7 kg)    Physical Exam GENERAL: Well developed, well nourished, no acute distress. HEAD EYES EARS NOSE THROAT: Normocephalic, atraumatic, conjunctiva not injected, sclera nonicteric. CARDIAC: Regular rate and rhythm, S1 S2 present, no murmur, dorsalis pedis 2 plus bilaterally, heart normal. NECK: Supple, no thyromegaly, no nodes, no carotid bruits. LUNGS: Clear to auscultation bilaterally, no wheezes. ABDOMEN: Bowel sounds present, soft, non-tender, non-distended, no hepatosplenomegaly, no masses. EXTREMITIES: No edema. MUSCULOSKELETAL: No gross abnormalities. NEUROLOGICAL:  Alert and oriented x3, cranial nerves II through XII intact. PSYCHIATRIC: Normal mood, good eye contact.   PDMP checked    Assessment & Plan:  Primary hypertension  Generalized anxiety disorder -     Escitalopram  Oxalate; Take 1 tablet (5 mg total) by mouth daily. Take a 5mg  and 10mg  tablet daily  Dispense: 90 tablet; Refill: 1 -     clonazePAM ; Take 1 tablet (2 mg total) by mouth at bedtime. Take with your 1 mg tablet at night for a total of 3 mg.  Dispense: 90 tablet; Refill: 0  Primary insomnia  Open-angle glaucoma of both eyes, mild stage, unspecified open-angle glaucoma type    Assessment and Plan Assessment & Plan Generalized anxiety disorder   Her anxiety is well-managed with escitalopram  15 mg daily, buspirone  5 mg twice daily, and clonazepam  2 mg HS. There is no suicidal ideation, and anxiety remains well-controlled. Clonazepam  helps manage anxiety and blood pressure but causes excessive saliva, especially at night. Continue escitalopram , buspirone , and clonazepam  at current dosages. Recommend Biotene spray for nighttime saliva management.  Hypertension   Blood pressure is well-controlled with felodipine  10 mg and valsartan 80 mg daily. Blood pressure readings are stable. Hydralazine  is available for emergencies but has not been needed. Continue felodipine  and valsartan at current dosages and keep hydralazine  on hand for emergencies.  Insomnia-chronic.  Controlled on clonazepam  2mg (down from 1mg )  Glaucoma   Current eye drops effectively manage glaucoma with no recent changes in medication or symptoms. Continue current glaucoma eye drops. Managed by ophth  Disturbances of salivary secretion   Excessive saliva, particularly at night-thick, is likely related to clonazepam  use. She manages daytime symptoms with water intake, but nighttime symptoms interfere with sleep. Recommend Biotene spray for nighttime use to manage excessive saliva.     Return for as sch in  July.  Jenkins CHRISTELLA Carrel, MD     [1]  Current Outpatient Medications:    busPIRone  (BUSPAR ) 5 MG tablet, Take 1 tablet (5 mg total) by mouth 2 (two) times daily., Disp: 180 tablet, Rfl: 1   dorzolamide -timolol  (COSOPT ) 2-0.5 % ophthalmic solution, Place 1 drop into both eyes 2 (two) times daily., Disp: 20 mL, Rfl: 4   escitalopram  (LEXAPRO ) 10 MG tablet, Take 1 tablet (10 mg total) by mouth daily., Disp: 90 tablet, Rfl: 1   felodipine  (PLENDIL ) 10 MG 24 hr tablet, Take 1 tablet (10 mg total) by mouth daily., Disp: 90 tablet, Rfl: 3   fluticasone  (FLONASE ) 50 MCG/ACT nasal spray, Place 1 spray into both nostrils daily., Disp: 16 g, Rfl: 11   hydrALAZINE  (APRESOLINE ) 10 MG tablet, Take 1-2 tablets (10-20 mg total) by mouth 2 (two) times daily. Can take an extra 1-2 tablets as needed for systolic BP > 160, Disp: 150 tablet, Rfl: 3   telmisartan  (MICARDIS ) 40 MG tablet, Take 2 tablets (80 mg total) by mouth at night., Disp: 180 tablet, Rfl: 3   Travoprost , BAK Free, (TRAVATAN ) 0.004 % SOLN ophthalmic solution, Place 1 drop into  both eyes every evening., Disp: 10 mL, Rfl: 3   clonazePAM  (KLONOPIN ) 2 MG tablet, Take 1 tablet (2 mg total) by mouth at bedtime. Take with your 1 mg tablet at night for a total of 3 mg., Disp: 90 tablet, Rfl: 0   escitalopram  (LEXAPRO ) 5 MG tablet, Take 1 tablet (5 mg total) by mouth daily. Take a 5mg  and 10mg  tablet daily, Disp: 90 tablet, Rfl: 1 [2]  Allergies Allergen Reactions   Hydralazine      dizziness   Telmisartan      Heart raced on 80mg  tab. Tolerates 2 of the 40mg  tabs.   "

## 2024-02-04 ENCOUNTER — Other Ambulatory Visit (HOSPITAL_BASED_OUTPATIENT_CLINIC_OR_DEPARTMENT_OTHER): Payer: Self-pay

## 2024-02-10 ENCOUNTER — Other Ambulatory Visit (HOSPITAL_BASED_OUTPATIENT_CLINIC_OR_DEPARTMENT_OTHER): Payer: Self-pay

## 2024-02-11 ENCOUNTER — Other Ambulatory Visit (HOSPITAL_BASED_OUTPATIENT_CLINIC_OR_DEPARTMENT_OTHER): Payer: Self-pay

## 2024-02-13 ENCOUNTER — Other Ambulatory Visit (HOSPITAL_BASED_OUTPATIENT_CLINIC_OR_DEPARTMENT_OTHER): Payer: Self-pay

## 2024-07-22 ENCOUNTER — Encounter: Admitting: Family Medicine

## 2024-11-09 ENCOUNTER — Ambulatory Visit
